# Patient Record
Sex: Male | Born: 1950
Health system: Southern US, Community
[De-identification: ages and names within clinical notes are randomized; demographics above are authoritative.]

## PROBLEM LIST (undated history)

## (undated) DIAGNOSIS — R531 Weakness: Secondary | ICD-10-CM

## (undated) DIAGNOSIS — E785 Hyperlipidemia, unspecified: Secondary | ICD-10-CM

## (undated) DIAGNOSIS — D571 Sickle-cell disease without crisis: Secondary | ICD-10-CM

## (undated) DIAGNOSIS — T300 Burn of unspecified body region, unspecified degree: Secondary | ICD-10-CM

## (undated) DIAGNOSIS — R200 Anesthesia of skin: Secondary | ICD-10-CM

## (undated) DIAGNOSIS — M25512 Pain in left shoulder: Secondary | ICD-10-CM

## (undated) DIAGNOSIS — I10 Essential (primary) hypertension: Secondary | ICD-10-CM

## (undated) DIAGNOSIS — M1990 Unspecified osteoarthritis, unspecified site: Secondary | ICD-10-CM

## (undated) HISTORY — PX: COLONOSCOPY: SHX174

## (undated) HISTORY — DX: Morbid (severe) obesity due to excess calories: E66.01

## (undated) HISTORY — PX: OTHER SURGICAL HISTORY: SHX169

## (undated) HISTORY — PX: FRACTURE SURGERY: SHX138

## (undated) HISTORY — DX: Hyperlipidemia, unspecified: E78.5

## (undated) HISTORY — DX: Weakness: R53.1

## (undated) HISTORY — PX: CHOLECYSTECTOMY: SHX55

## (undated) HISTORY — PX: GALLBLADDER SURGERY: SHX652

## (undated) HISTORY — DX: Burn of unspecified body region, unspecified degree: T30.0

## (undated) HISTORY — DX: Pain in left shoulder: M25.512

## (undated) HISTORY — DX: Essential (primary) hypertension: I10

## (undated) HISTORY — DX: Anesthesia of skin: R20.0

## (undated) HISTORY — DX: Unspecified osteoarthritis, unspecified site: M19.90

## (undated) HISTORY — DX: Sickle-cell disease without crisis: D57.1

---

## 2000-03-20 ENCOUNTER — Inpatient Hospital Stay (HOSPITAL_COMMUNITY): Admission: EM | Admit: 2000-03-20 | Discharge: 2000-03-26 | Payer: Self-pay | Admitting: Emergency Medicine

## 2000-03-20 ENCOUNTER — Encounter: Payer: Self-pay | Admitting: Emergency Medicine

## 2000-03-20 ENCOUNTER — Encounter: Payer: Self-pay | Admitting: Cardiology

## 2000-03-21 ENCOUNTER — Encounter: Payer: Self-pay | Admitting: Cardiology

## 2002-12-13 DIAGNOSIS — I2699 Other pulmonary embolism without acute cor pulmonale: Secondary | ICD-10-CM

## 2002-12-13 HISTORY — DX: Other pulmonary embolism without acute cor pulmonale: I26.99

## 2003-07-18 ENCOUNTER — Encounter: Payer: Self-pay | Admitting: General Surgery

## 2003-07-18 ENCOUNTER — Emergency Department (HOSPITAL_COMMUNITY): Admission: AC | Admit: 2003-07-18 | Discharge: 2003-07-18 | Payer: Self-pay

## 2004-03-25 ENCOUNTER — Inpatient Hospital Stay (HOSPITAL_COMMUNITY): Admission: EM | Admit: 2004-03-25 | Discharge: 2004-03-29 | Payer: Self-pay | Admitting: Emergency Medicine

## 2009-02-26 ENCOUNTER — Encounter: Admission: RE | Admit: 2009-02-26 | Discharge: 2009-02-26 | Payer: Self-pay | Admitting: Chiropractic Medicine

## 2009-04-18 ENCOUNTER — Encounter: Admission: RE | Admit: 2009-04-18 | Discharge: 2009-04-18 | Payer: Self-pay | Admitting: Family Medicine

## 2011-04-30 NOTE — Discharge Summary (Signed)
[place]  Patient:    Jerome Lee, Jerome Lee                      MRN: [id number] Adm. Date:  [id number] Disc. Date: [id number] Attending:  Pola Corn Dictator:   Lyla Son. Achilles Dunk.                           Discharge Summary  DISCHARGE DIAGNOSES: 1. Pulmonary embolism. 2. Pneumonia. 3. Pulmonary collapse. 4. Hyperlipidemia.  HISTORY OF PRESENT ILLNESS:  This is a 60 year old patient who was seen initially at the emergency department at [facility] with complaint of shortness of breath and right side pain.  The patient was noted to have pulse oximetries in the emergency department of 90 to 94% and he complained of severe right pain in the chest associated with shortness of breath.  The patient had a CT scan of the chest and the lower extremity and it was found to be positive for pulmonary embolus.  Patient was subsequently admitted for the same.  HOSPITAL COURSE:  Patient admitted to the medical service.  He was placed on heparin therapy by pharmacy protocol.  A pulmonary consultation was obtained due to persistent hypoxemia.  The white blood cell count on this patient is 13,000, hemoglobin 15.4.  The pulmonary consultant noted that there was bilateral pulmonary embolus with the right being greater than the left.  He also agreed with the opinion of right lower lobe pneumonia.  It was suspected that this was superimposed on a left lower lobe community-acquired pneumonia with hypoxemia.  Zithromax and Rocephin were used.  On April 10, the patient became less short of breath.  On March 24, 2000, it was the opinion that patient had shown significant improvement and was ready for discharge.  Pulmonary consultant agreed that the patient could be discharged with very close follow-up. DD:  04/27/00 TD:  04/28/00 Job: [id number]
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
## 2011-04-30 NOTE — Op Note (Signed)
NAME:  Jerome Lee, Jerome Lee                         ACCOUNT NO.:  [id number]   MEDICAL RECORD NO.:  [id number]                   PATIENT TYPE:  INP   LOCATION:  5004                                 FACILITY:  [facility]   PHYSICIAN:  Elana Alm. Thurston Hole, M.D.              DATE OF BIRTH:  [birth year]   DATE OF PROCEDURE:  03/26/2004  DATE OF DISCHARGE:                                 OPERATIVE REPORT   PREOPERATIVE DIAGNOSES:  1. Right tibial plateau fracture.  2. Right lateral meniscus tear.   POSTOPERATIVE DIAGNOSES:  1. Right tibial plateau fracture.  2. Right lateral meniscus tear.  3. Right lower leg pre-compartment syndrome.   PROCEDURES:  1. Open reduction and internal fixation of right tibial plateau fracture.  2. Right knee lateral meniscus repair.  3. Right leg anterior and lateral compartment fasciotomies.   SURGEON:  Elana Alm. Thurston Hole, M.D.   ASSISTANT:  Julien Girt, P.A.   ANESTHESIA:  General.   OPERATIVE TIME:  One hour.   COMPLICATIONS:  None.   DESCRIPTION OF PROCEDURE:  Mr Juhasz was brought to the operating room on  March 27, 2003, placed on the operating table in the supine position.  After  an adequate level of general anesthesia was obtained, his right leg was  prepped using sterile Duraprep and draped using sterile technique.  He  received Ancef 1 g IV preoperatively for prophylaxis.  The leg was  exsanguinated and a thigh tourniquet elevated at 375 mm.  Initially through  a 15 cm longitudinal incision based over the proximal tibia, initial  exposure was made.  The underlying subcutaneous tissues were incised along  with the skin incision.  Subperiosteally the anterior and lateral tibial  metaphysis was exposed.  An arthrotomy was performed as well in the lateral  aspect of the joint.  The fracture was well-identified in the anterior and  lateral tibial plateau.  The lateral meniscus was found to be torn as well  anteriorly, and this was repaired  primarily with 0 Vicryl sutures.  The  tibial plateau fracture was manually reduced and confirmed under  fluoroscopy.  After this was done, then a Synthes locking L plate was placed  on the anterior lateral aspect of the tibial plateau and under fluoroscopic  control the three proximal screws were placed after pre-drilling with  fluoroscopy guidance and then placing the appropriate length 5.5 mm screws.  The distal screw holes in the plate were then sequentially pre-drilled and  locking screws of appropriate length, 4.5 mm screws, were placed as well.  After this was done AP and lateral fluoroscopic x-rays confirmed anatomic  reduction of the fracture and satisfactory position of the hardware.  At  this point then anterior and lateral fasciotomies were performed.  There was  found to be moderate tightness to this compartment, but this was easily  decompressed.  The posterior compartment was not excessively tight.  The  tourniquet was released.  Copious irrigation was performed.  Only small  venous bleeders were found, and these were cauterized.  Then the arthrotomy  was closed, the fascia closed loosely over the plate with 0 Vicryl,  subcutaneous tissues closed with 0 Vicryl, skin closed with skin staples.  Sterile dressings were applied.  Distal neurologic function was found to be  intact, pulses 1+ and symmetric postoperatively.  The patient then awakened,  extubated, and taken to the recovery room in stable condition.  Needle and  sponge counts correct x2 at the end of the case.                                               Robert A. Thurston Hole, M.D.    RAW/MEDQ  D:  03/27/2004  T:  03/27/2004  Job:  [id number]   cc:   Workers Comp carrier
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
## 2013-01-18 ENCOUNTER — Telehealth: Payer: Self-pay | Admitting: Family Medicine

## 2013-01-18 ENCOUNTER — Ambulatory Visit
Admission: RE | Admit: 2013-01-18 | Discharge: 2013-01-18 | Disposition: A | Discharge status: Home / Self Care | Payer: BC Managed Care – PPO | Source: Ambulatory Visit | Attending: Emergency Medicine | Admitting: Emergency Medicine

## 2013-01-18 ENCOUNTER — Ambulatory Visit (INDEPENDENT_AMBULATORY_CARE_PROVIDER_SITE_OTHER): Payer: BC Managed Care – PPO | Admitting: Emergency Medicine

## 2013-01-18 VITALS — BP 147/78 | HR 62 | Temp 98.3°F | Resp 16 | Ht 65.0 in | Wt 244.0 lb

## 2013-01-18 DIAGNOSIS — R200 Anesthesia of skin: Secondary | ICD-10-CM

## 2013-01-18 DIAGNOSIS — R202 Paresthesia of skin: Secondary | ICD-10-CM

## 2013-01-18 DIAGNOSIS — R209 Unspecified disturbances of skin sensation: Secondary | ICD-10-CM

## 2013-01-18 NOTE — Progress Notes (Signed)
  Subjective:    Patient ID: Jerome Lee, male    DOB: 26-May-1951, 62 y.o.   MRN: [id number]  HPI yesterday around 3 PM patient had episode where he had numbness on the right side of his face. At that time he also felt a numb tingling sensation in the fourth and fifth fingers of his right hand. The numbness in the right side of the face resolved however he continues to feel numbness in his fourth and fifth fingers. He denies headache he denies any history of medical problems to include diabetes high blood heart disease or any other medical problems is not a heavy drinker does not smoke is on no medications.    Review of Systems     Objective:   Physical Exam physical exam reveals an alert cooperative gentleman who is in no distress his HEENT exam is unremarkable his neck is supple without carotid bruits his chest is clear to auscultation and percussion his heart is regular rate without murmurs rubs or gallops the abdomen is soft liver and spleen not large. Neurologically cranial nerves II through XII are intact. Motor strength is 5 out of 5 all muscle groups he has decreased sensation in the fourth and fifth fingers of his right hand        Assessment & Plan:  The numbness in his right hand seemed to be more peripheral than central in origin. We'll do a CT of the head to be sure this is okay. Referral being made to neurology to see if we can be out what the source of the numbness is. CT of the head report shows chronic microvascular changes. We'll proceed with carotid Doppler studies as well as neurology consult.

## 2013-01-18 NOTE — Patient Instructions (Addendum)
You can go to [facility] today for your CT scan. I have called [employer] and your scan has been authorized for today,the auth. number is  [id number].   Driving directions to [address], [place], [place] [address] [id number]  - more info    [address]  [place], [place] [address]     1. Head south on [address] toward [address]      0.5 mi    2. Sharp left onto [address]      0.6 mi    3. Turn left onto the [address] ramp      0.2 mi    4. Merge onto [address]      3.0 mi    5. Continue straight to stay on [address]      0.4 mi    6. Slight left to stay on [facility]  Destination will be on the right     1.0 mi     [address] [place], [place] [address]

## 2013-01-18 NOTE — Telephone Encounter (Signed)
[facility] called with call report for CT. See report. Verbally told Dr. Cleta Alberts. Per Dr. Cleta Alberts called Jerome Lee and told him we are making Neuro appt and Carotid studies are being set up and we will call him when we have that done. He voiced understanding.

## 2013-01-19 ENCOUNTER — Ambulatory Visit
Admission: RE | Admit: 2013-01-19 | Discharge: 2013-01-19 | Disposition: A | Discharge status: Home / Self Care | Payer: BC Managed Care – PPO | Source: Ambulatory Visit | Attending: Emergency Medicine | Admitting: Emergency Medicine

## 2013-01-19 DIAGNOSIS — R200 Anesthesia of skin: Secondary | ICD-10-CM

## 2013-01-22 ENCOUNTER — Other Ambulatory Visit: Payer: Self-pay | Admitting: Emergency Medicine

## 2013-01-22 DIAGNOSIS — R200 Anesthesia of skin: Secondary | ICD-10-CM

## 2013-02-26 ENCOUNTER — Other Ambulatory Visit: Payer: Self-pay | Admitting: Neurology

## 2013-02-26 DIAGNOSIS — R209 Unspecified disturbances of skin sensation: Secondary | ICD-10-CM

## 2013-03-15 ENCOUNTER — Other Ambulatory Visit: Payer: BC Managed Care – PPO

## 2013-03-29 ENCOUNTER — Ambulatory Visit (INDEPENDENT_AMBULATORY_CARE_PROVIDER_SITE_OTHER): Payer: BC Managed Care – PPO | Admitting: Neurology

## 2013-03-29 ENCOUNTER — Encounter: Payer: Self-pay | Admitting: Neurology

## 2013-03-29 VITALS — BP 129/75 | Ht 65.0 in | Wt 236.0 lb

## 2013-03-29 DIAGNOSIS — R5383 Other fatigue: Secondary | ICD-10-CM

## 2013-03-29 DIAGNOSIS — R531 Weakness: Secondary | ICD-10-CM

## 2013-03-29 DIAGNOSIS — T300 Burn of unspecified body region, unspecified degree: Secondary | ICD-10-CM | POA: Insufficient documentation

## 2013-03-29 DIAGNOSIS — R200 Anesthesia of skin: Secondary | ICD-10-CM | POA: Insufficient documentation

## 2013-03-29 DIAGNOSIS — R5381 Other malaise: Secondary | ICD-10-CM

## 2013-03-29 DIAGNOSIS — R209 Unspecified disturbances of skin sensation: Secondary | ICD-10-CM
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
## 2014-06-17 ENCOUNTER — Encounter: Payer: Self-pay | Admitting: Family Medicine

## 2014-06-17 ENCOUNTER — Ambulatory Visit (INDEPENDENT_AMBULATORY_CARE_PROVIDER_SITE_OTHER): Payer: BC Managed Care – PPO | Admitting: Family Medicine

## 2014-06-17 ENCOUNTER — Other Ambulatory Visit: Payer: Self-pay | Admitting: Family Medicine

## 2014-06-17 VITALS — BP 138/70 | HR 88 | Resp 16 | Ht 64.5 in | Wt 237.0 lb

## 2014-06-17 DIAGNOSIS — Z0000 Encounter for general adult medical examination without abnormal findings: Secondary | ICD-10-CM

## 2014-06-17 DIAGNOSIS — Z13220 Encounter for screening for lipoid disorders: Secondary | ICD-10-CM

## 2014-06-17 DIAGNOSIS — E669 Obesity, unspecified: Secondary | ICD-10-CM

## 2014-06-17 DIAGNOSIS — R1906 Epigastric swelling, mass or lump: Secondary | ICD-10-CM

## 2014-06-17 DIAGNOSIS — Z125 Encounter for screening for malignant neoplasm of prostate: Secondary | ICD-10-CM

## 2014-06-17 LAB — COMPREHENSIVE METABOLIC PANEL
ALT: 26 U/L (ref 0–53)
AST: 24 U/L (ref 0–37)
Albumin: 4.5 g/dL (ref 3.5–5.2)
Alkaline Phosphatase: 56 U/L (ref 39–117)
BUN: 10 mg/dL (ref 6–23)
CO2: 22 mEq/L (ref 19–32)
Calcium: 9.5 mg/dL (ref 8.4–10.5)
Chloride: 104 mEq/L (ref 96–112)
Creat: 1.19 mg/dL (ref 0.50–1.35)
Glucose, Bld: 91 mg/dL (ref 70–99)
Potassium: 4.7 mEq/L (ref 3.5–5.3)
Sodium: 139 mEq/L (ref 135–145)
Total Bilirubin: 1.6 mg/dL — ABNORMAL HIGH (ref 0.2–1.2)
Total Protein: 7.6 g/dL (ref 6.0–8.3)

## 2014-06-17 LAB — LIPID PANEL
Cholesterol: 233 mg/dL — ABNORMAL HIGH (ref 0–200)
HDL: 44 mg/dL (ref >39)
LDL Cholesterol: 177 mg/dL — ABNORMAL HIGH (ref 0–99)
Total CHOL/HDL Ratio: 5.3 Ratio
Triglycerides: 60 mg/dL (ref <150)
VLDL: 12 mg/dL (ref 0–40)

## 2014-06-17 LAB — CBC
HCT: 48.8 % (ref 39.0–52.0)
Hemoglobin: 16.5 g/dL (ref 13.0–17.0)
MCH: 28.4 pg (ref 26.0–34.0)
MCHC: 33.8 g/dL (ref 30.0–36.0)
MCV: 83.8 fL (ref 78.0–100.0)
Platelets: 266 10*3/uL (ref 150–400)
RBC: 5.82 MIL/uL — ABNORMAL HIGH (ref 4.22–5.81)
RDW: 13.7 % (ref 11.5–15.5)
WBC: 7.3 10*3/uL (ref 4.0–10.5)

## 2014-06-17 NOTE — Patient Instructions (Addendum)
Call [facility] and ask about your colonoscopy- it may be coming due [place], [place] [address]   Ph: [phone]   Get the shingles vaccine (zostavax) at a drug store at your convenience I will be in touch with your labs Your blood pressure looks ok.  You have lost a few lbs- good job!  Keep it up  Wt Readings from Last 3 Encounters:  06/17/14 237 lb (107.502 kg)  03/29/13 236 lb (107.049 kg)  01/18/13 244 lb (110.678 kg)
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
## 2014-06-18 LAB — PSA: PSA: 1.07 ng/mL (ref <=4.00)

## 2014-06-21 LAB — BILIRUBIN, FRACTIONATED(TOT/DIR/INDIR)
Bilirubin, Direct: 0.2 mg/dL (ref 0.0–0.3)
Indirect Bilirubin: 1.4 mg/dL — ABNORMAL HIGH (ref 0.2–1.2)
Total Bilirubin: 1.6 mg/dL — ABNORMAL HIGH (ref 0.2–1.2)

## 2014-06-22 ENCOUNTER — Encounter: Payer: Self-pay | Admitting: Family Medicine

## 2014-10-21 ENCOUNTER — Ambulatory Visit: Payer: BC Managed Care – PPO | Admitting: Family Medicine

## 2014-11-18 ENCOUNTER — Encounter: Payer: Self-pay | Admitting: Family Medicine

## 2014-11-18 ENCOUNTER — Ambulatory Visit (INDEPENDENT_AMBULATORY_CARE_PROVIDER_SITE_OTHER): Payer: BC Managed Care – PPO | Admitting: Family Medicine

## 2014-11-18 VITALS — BP 148/77 | HR 73 | Temp 98.2°F | Resp 16 | Ht 64.5 in | Wt 235.0 lb

## 2014-11-18 DIAGNOSIS — E669 Obesity, unspecified: Secondary | ICD-10-CM

## 2014-11-18 DIAGNOSIS — R1906 Epigastric swelling, mass or lump: Secondary | ICD-10-CM

## 2014-11-18 DIAGNOSIS — Z23 Encounter for immunization: Secondary | ICD-10-CM

## 2014-11-18 LAB — COMPREHENSIVE METABOLIC PANEL
ALT: 23 U/L (ref 0–53)
AST: 19 U/L (ref 0–37)
Albumin: 4.3 g/dL (ref 3.5–5.2)
Alkaline Phosphatase: 59 U/L (ref 39–117)
BUN: 9 mg/dL (ref 6–23)
CO2: 28 mEq/L (ref 19–32)
Calcium: 10 mg/dL (ref 8.4–10.5)
Chloride: 103 mEq/L (ref 96–112)
Creat: 1.29 mg/dL (ref 0.50–1.35)
Glucose, Bld: 99 mg/dL (ref 70–99)
Potassium: 4.7 mEq/L (ref 3.5–5.3)
Sodium: 139 mEq/L (ref 135–145)
Total Bilirubin: 1.4 mg/dL — ABNORMAL HIGH (ref 0.2–1.2)
Total Protein: 7.7 g/dL (ref 6.0–8.3)

## 2014-11-18 LAB — HEMOGLOBIN A1C
Hgb A1c MFr Bld: 4.8 % (ref <5.7)
Mean Plasma Glucose: 91 mg/dL (ref <117)

## 2014-11-18 LAB — LIPID PANEL
Cholesterol: 195 mg/dL (ref 0–200)
HDL: 44 mg/dL (ref >39)
LDL Cholesterol: 135 mg/dL — ABNORMAL HIGH (ref 0–99)
Total CHOL/HDL Ratio: 4.4 Ratio
Triglycerides: 80 mg/dL (ref <150)
VLDL: 16 mg/dL (ref 0–40)
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
## 2014-11-18 NOTE — Patient Instructions (Addendum)
Please do work on getting about 30 minutes of brisk walking most days of the week!  This will really improve your health and help with weight loss.  Also consider joining weight watchers for group support and help with weight loss.  You are in danger of developing high blood pressure and diabetes if you do not lose weight I will be in touch with your labs asap.   We will set up your visit with [facility] to look at your abdominal mass and also have your colonoscopy. Please do be sure to get this done soon because I am concerned about this mass,  If you do not hear about this appt please call me  Thanks for getting your flu shot today!!

## 2016-01-02 ENCOUNTER — Encounter: Payer: Self-pay | Admitting: Family Medicine

## 2016-01-07 ENCOUNTER — Encounter: Payer: Self-pay | Admitting: Family Medicine

## 2016-11-27 ENCOUNTER — Emergency Department (HOSPITAL_COMMUNITY): Payer: BC Managed Care – PPO

## 2016-11-27 ENCOUNTER — Encounter (HOSPITAL_COMMUNITY): Payer: Self-pay | Admitting: *Deleted

## 2016-11-27 ENCOUNTER — Emergency Department (HOSPITAL_COMMUNITY)
Admission: EM | Admit: 2016-11-27 | Discharge: 2016-11-28 | Disposition: A | Discharge status: Home / Self Care | Payer: BC Managed Care – PPO | Attending: Emergency Medicine | Admitting: Emergency Medicine

## 2016-11-27 DIAGNOSIS — R0789 Other chest pain: Secondary | ICD-10-CM

## 2016-11-27 DIAGNOSIS — I2699 Other pulmonary embolism without acute cor pulmonale: Secondary | ICD-10-CM | POA: Insufficient documentation

## 2016-11-27 DIAGNOSIS — R071 Chest pain on breathing: Secondary | ICD-10-CM | POA: Diagnosis present

## 2016-11-27 LAB — BASIC METABOLIC PANEL
Anion gap: 11 (ref 5–15)
BUN: 12 mg/dL (ref 6–20)
CO2: 23 mmol/L (ref 22–32)
Calcium: 9.7 mg/dL (ref 8.9–10.3)
Chloride: 105 mmol/L (ref 101–111)
Creatinine, Ser: 1.2 mg/dL (ref 0.61–1.24)
GFR calc Af Amer: >60 mL/min (ref >60)
GFR calc non Af Amer: >60 mL/min (ref >60)
Glucose, Bld: 102 mg/dL — ABNORMAL HIGH (ref 65–99)
Potassium: 3.8 mmol/L (ref 3.5–5.1)
Sodium: 139 mmol/L (ref 135–145)

## 2016-11-27 LAB — CBC
HCT: 46.9 % (ref 39.0–52.0)
Hemoglobin: 16 g/dL (ref 13.0–17.0)
MCH: 28.3 pg (ref 26.0–34.0)
MCHC: 34.1 g/dL (ref 30.0–36.0)
MCV: 82.9 fL (ref 78.0–100.0)
Platelets: 215 10*3/uL (ref 150–400)
RBC: 5.66 MIL/uL (ref 4.22–5.81)
RDW: 13.2 % (ref 11.5–15.5)
WBC: 11.3 10*3/uL — ABNORMAL HIGH (ref 4.0–10.5)

## 2016-11-27 LAB — I-STAT TROPONIN, ED: Troponin i, poc: 0 ng/mL (ref 0.00–0.08)

## 2016-11-27 MED ORDER — GI COCKTAIL ~~LOC~~
30.0000 mL | Freq: Once | ORAL | Status: AC
  Administered 2016-11-27: 30 mL via ORAL
  Filled 2016-11-27: qty 30
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
## 2016-11-27 NOTE — ED Triage Notes (Signed)
Pt is here for central chest pain that began today.  Pt initially thought it was gas. Pain increases when taking a deep breath.  No cardiac history.  Pt reports hx of blood clot in his lung and states that the pain feels similar.  Pt denies any SOB.

## 2016-11-28 ENCOUNTER — Emergency Department (HOSPITAL_COMMUNITY): Payer: BC Managed Care – PPO

## 2016-11-28 LAB — I-STAT TROPONIN, ED: Troponin i, poc: 0 ng/mL (ref 0.00–0.08)

## 2016-11-28 LAB — D-DIMER, QUANTITATIVE: D-Dimer, Quant: 1.12 ug/mL-FEU — ABNORMAL HIGH (ref 0.00–0.50)

## 2016-11-28 MED ORDER — FUROSEMIDE 10 MG/ML IJ SOLN
20.0000 mg | Freq: Once | INTRAMUSCULAR | Status: AC
  Administered 2016-11-28: 20 mg via INTRAVENOUS
  Filled 2016-11-28: qty 2

## 2016-11-28 MED ORDER — RIVAROXABAN (XARELTO) VTE STARTER PACK (15 & 20 MG): ORAL_TABLET | ORAL | 0 refills | Status: DC

## 2016-11-28 MED ORDER — ONDANSETRON HCL 4 MG/2ML IJ SOLN
4.0000 mg | Freq: Once | INTRAMUSCULAR | Status: AC
  Administered 2016-11-28: 4 mg via INTRAVENOUS
  Filled 2016-11-28: qty 2

## 2016-11-28 MED ORDER — MORPHINE SULFATE (PF) 4 MG/ML IV SOLN
4.0000 mg | Freq: Once | INTRAVENOUS | Status: AC
  Administered 2016-11-28: 4 mg via INTRAVENOUS
  Filled 2016-11-28: qty 1

## 2016-11-28 MED ORDER — IOPAMIDOL (ISOVUE-370) INJECTION 76%
INTRAVENOUS | Status: AC
  Administered 2016-11-28: 100 mL
  Filled 2016-11-28: qty 100

## 2016-11-28 MED ORDER — RIVAROXABAN 15 MG PO TABS
15.0000 mg | ORAL_TABLET | Freq: Once | ORAL | Status: AC
  Administered 2016-11-28: 15 mg via ORAL
  Filled 2016-11-28: qty 1

## 2016-11-28 NOTE — ED Notes (Signed)
Pt back from CT

## 2016-11-28 NOTE — ED Notes (Signed)
Pt education on xarelto done by pharmacy.

## 2016-11-28 NOTE — ED Provider Notes (Signed)
Care assumed from Dr. Maryan Rued. CTPE pending for atypical chest pain with remote history of PE.  Troponin negative after >12 hours of pain.  CT shows very small nonocclusive pulmonary embolism. Patient is not hypoxic and not tachycardic. Denies any shortness of breath. His chest pain is unchanged. We'll treat with xarelto. Risks and benefits discussed with patient.  He is ambulatory without desaturation and comfortable with plan for outpatient treatment. Return precautions and PCP followup discussed.  Blood pressure 136/82, pulse 76, temperature 98 F (36.7 C), temperature source Oral, resp. rate 18, weight 230 lb (104.3 kg), SpO2 99 %.      Ezequiel Essex, MD 11/28/16 1002

## 2016-11-28 NOTE — ED Notes (Signed)
Patient transported to CT 

## 2016-11-28 NOTE — Discharge Instructions (Signed)
Take the blood thinner as prescribed. [place] with your doctor. Return to the ED if you develop new or worsening symptoms.  Information on my medicine - XARELTO (rivaroxaban)  This medication education was reviewed with me or my healthcare representative as part of my discharge preparation.  The pharmacist that spoke with me during my hospital stay was:  Sindy Guadeloupe, [place]? Xarelto was prescribed to treat blood clots that may have been found in the veins of your legs (deep vein thrombosis) or in your lungs (pulmonary embolism) and to reduce the risk of them occurring again.  What do you need to know about Xarelto? The starting dose is one 15 mg tablet taken TWICE daily with food for the FIRST 21 DAYS then on 12/17/16  the dose is changed to one 20 mg tablet taken ONCE A DAY with your evening meal.  DO NOT stop taking Xarelto without talking to the health care provider who prescribed the medication.  Refill your prescription for 20 mg tablets before you run out.  After discharge, you should have regular check-up appointments with your healthcare provider that is prescribing your Xarelto.  In the future your dose may need to be changed if your kidney function changes by a significant amount.  What do you do if you miss a dose? If you are taking Xarelto TWICE DAILY and you miss a dose, take it as soon as you remember. You may take two 15 mg tablets (total 30 mg) at the same time then resume your regularly scheduled 15 mg twice daily the next day.  If you are taking Xarelto ONCE DAILY and you miss a dose, take it as soon as you remember on the same day then continue your regularly scheduled once daily regimen the next day. Do not take two doses of Xarelto at the same time.   Important Safety Information Xarelto is a blood thinner medicine that can cause bleeding. You should call your healthcare provider right away if you experience any of the  following: ? Bleeding from an injury or your nose that does not stop. ? Unusual colored urine (red or dark brown) or unusual colored stools (red or black). ? Unusual bruising for unknown reasons. ? A serious fall or if you hit your head (even if there is no bleeding).  Some medicines may interact with Xarelto and might increase your risk of bleeding while on Xarelto. To help avoid this, consult your healthcare provider or pharmacist prior to using any new prescription or non-prescription medications, including herbals, vitamins, non-steroidal anti-inflammatory drugs (NSAIDs) and supplements.  This website has more information on Xarelto: [email or web address]-benson.[email or web address]

## 2016-11-28 NOTE — ED Notes (Signed)
Pt. Ambulated 151ft. SpO2 did not drop below 92.

## 2016-12-03 ENCOUNTER — Ambulatory Visit (INDEPENDENT_AMBULATORY_CARE_PROVIDER_SITE_OTHER): Payer: BC Managed Care – PPO | Admitting: Emergency Medicine

## 2016-12-03 VITALS — BP 126/80 | HR 66 | Temp 98.8°F | Resp 17 | Ht 65.5 in | Wt 225.0 lb

## 2016-12-03 DIAGNOSIS — I2699 Other pulmonary embolism without acute cor pulmonale: Secondary | ICD-10-CM

## 2016-12-03 NOTE — Patient Instructions (Addendum)
IF you received an x-ray today, you will receive an invoice from [facility]. Please contact [facility] at [phone] with questions or concerns regarding your invoice.   IF you received labwork today, you will receive an invoice from [facility]. Please contact [facility] at [phone] with questions or concerns regarding your invoice.   Our billing staff will not be able to assist you with questions regarding bills from these companies.  You will be contacted with the lab results as soon as they are available. The fastest way to get your results is to activate your My Chart account. Instructions are located on the last page of this paperwork. If you have not heard from [place] regarding the results in 2 weeks, please contact this office.      Pulmonary Embolism A pulmonary embolism (PE) is a sudden blockage or decrease of blood flow in one lung or both lungs. Most blockages come from a blood clot that travels from the legs or the pelvis to the lungs. PE is a dangerous and potentially life-threatening condition if it is not treated right away. What are the causes? A pulmonary embolism occurs most commonly when a blood clot travels from one of your veins to your lungs. Rarely, PE is caused by air, fat, amniotic fluid, or part of a tumor traveling through your veins to your lungs. What increases the risk? A PE is more likely to develop in:  People who smoke.  People who areolder, especially over 57 years of age.  People who are overweight (obese).  People who sit or lie still for a long time, such as during long-distance travel (over 4 hours), bed rest, hospitalization, or during recovery from certain medical conditions like a stroke.  People who do not engage in much physical activity (sedentary lifestyle).  People who have chronic breathing disorders.  People whohave a personal or family history of blood clots or blood clotting disease.  People whohave  peripheral vascular disease (PVD), diabetes, or some types of cancer.  People who haveheart disease, especially if the person had a recent heart attack or has congestive heart failure.  People who have neurological diseases that affect the legs (leg paresis).  People who have had a traumatic injury, such as breaking a hip or leg.  People whohave recently had major or lengthy surgery, especially on the hip, knee, or abdomen.  People who have hada central line placed inside a large vein.  People who takemedicines that contain the hormone estrogen. These include birth control pills and hormone replacement therapy.  Pregnancy or during childbirth or the postpartum period. What are the signs or symptoms? The symptoms of a PE usually start suddenly and include:  Shortness of breath while active or at rest.  Coughing or coughing up blood or blood-tinged mucus.  Chest pain that is often worse with deep breaths.  Rapid or irregular heartbeat.  Feeling light-headed or dizzy.  Fainting.  Feelinganxious.  Sweating. There may also be pain and swelling in a leg if that is where the blood clot started. These symptoms may represent a serious problem that is an emergency. Do not wait to see if the symptoms will go away. Get medical help right away. Call your local emergency services (911 in the [place] Do not drive yourself to the hospital.  How is this diagnosed? Your health care provider will take a medical history and perform a physical exam. You may also have other tests, including:  Blood tests to assess the  clotting properties of your blood, assess oxygen levels in your blood, and find blood clots.  Imaging tests, such as CT, ultrasound, MRI, X-ray, and other tests to see if you have clots anywhere in your body.  An electrocardiogram (ECG) to look for heart strain from blood clots in the lungs. How is this treated? The main goals of PE treatment are:  To stop a blood clot from  growing larger.  To stop new blood clots from forming. The type of treatment that you receive depends on many factors, such as the cause of your PE, your risk for bleeding or developing more clots, and other medical conditions that you have. Sometimes, a combination of treatments is necessary. This condition may be treated with:  Medicines, including newer oral blood thinners (anticoagulants), warfarin, low molecular weight heparins, thrombolytics, or heparins.  Wearing compression stockings or using different types of devices.  Surgery (rare) to remove the blood clot or to place a filter in your abdomen to stop the blood clot from traveling to your lungs. Treatments for a PE are often divided into immediate treatment, long-term treatment (up to 3 months after PE), and extended treatment (more than 3 months after PE). Your treatment may continue for several months. This is called maintenance therapy, and it is used to prevent the forming of new blood clots. You can work with your health care provider to choose the treatment program that is best for you. What are anticoagulants?  Anticoagulants are medicines that treat PEs. They can stop current blood clots from growing and stop new clots from forming. They cannot dissolve existing clots. Your body dissolves clots by itself over time. Anticoagulants are given by mouth, by injection, or through an IV tube. What are thrombolytics?  Thrombolytics are clot-dissolving medicines that are used to dissolve a PE. They carry a high risk of bleeding, so they tend to be used only in severe cases or if you have very low blood pressure. Follow these instructions at home: If you are taking a newer oral anticoagulant:  Take the medicine every single day at the same time each day.  Understand what foods and drugs interact with this medicine.  Understand that there are no regular blood tests required when using this medicine.  Understandthe side effects of  this medicine, including excessive bruising or bleeding. Ask your health care provider or pharmacist about other possible side effects. If you are taking warfarin:  Understand how to take warfarin and know which foods can affect how warfarin works in Veterinary surgeon.  Understand that it is dangerous to taketoo much or too little warfarin. Too much warfarin increases the risk of bleeding. Too little warfarin continues to allow the risk for blood clots.  Follow your PT and INR blood testing schedule. The PT and INR results allow your health care provider to adjust your dose of warfarin. It is very important that you have your PT and INR tested as often as told by your health care provider.  Avoid major changes in your diet, or tell your health care provider before you change your diet. Arrange a visit with a registered dietitian to answer your questions. Many foods, especially foods that are high in vitamin K, can interfere with warfarin and affect the PT and INR results. Eat a consistent amount of foods that are high in vitamin K, such as:  Spinach, kale, broccoli, cabbage, collard greens, turnip greens, Brussels sprouts, peas, cauliflower, seaweed, and parsley.  Beef liver and pork liver.  Green tea.  Soybean oil.  Tell your health care provider about any and all medicines, vitamins, and supplements that you take, including aspirin and other over-the-counter anti-inflammatory medicines. Be especially cautious with aspirin and anti-inflammatory medicines. Do not take those before you ask your health care provider if it is safe to do so. This is important because many medicines can interfere with warfarin and affect the PT and INR results.  Do not start or stop taking any over-the-counter or prescription medicine unless your health care provider or pharmacist tells you to do so. If you take warfarin, you will also need to do these things:  Hold pressure over cuts for longer than usual.  Tell your  dentist and other health care providers that you are taking warfarin before you have any procedures in which bleeding may occur.  Avoid alcohol or drink very small amounts. Tell your health care provider if you change your alcohol intake.  Do not use tobacco products, including cigarettes, chewing tobacco, and e-cigarettes. If you need help quitting, ask your health care provider.  Avoid contact sports. General instructions  Take over-the-counter and prescription medicines only as told by your health care provider. Anticoagulant medicines can have side effects, including easy bruising and difficulty stopping bleeding. If you are prescribed an anticoagulant, you will also need to do these things:  Hold pressure over cuts for longer than usual.  Tell your dentist and other health care providers that you are taking anticoagulants before you have any procedures in which bleeding may occur.  Avoid contact sports.  Wear a medical alert bracelet or carry a medical alert card that says you have had a PE.  Ask your health care provider how soon you can go back to your normal activities. Stay active to prevent new blood clots from forming.  Make sure to exercise while traveling or when you have been sitting or standing for a long period of time. It is very important to exercise. Exercise your legs by walking or by tightening and relaxing your leg muscles often. Take frequent walks.  Wear compression stockings as told by your health care provider to help prevent more blood clots from forming.  Do not use tobacco products, including cigarettes, chewing tobacco, and e-cigarettes. If you need help quitting, ask your health care provider.  Keep all follow-up appointments with your health care provider. This is important. How is this prevented? Take these actions to decrease your risk of developing another PE:  Exercise regularly. For at least 30 minutes every day, engage in:  Activity that involves  moving your arms and legs.  Activity that encourages good blood flow through your body by increasing your heart rate.  Exercise your arms and legs every hour during long-distance travel (over 4 hours). Drink plenty of water and avoid drinking alcohol while traveling.  Avoid sitting or lying in bed for long periods of time without moving your legs.  Maintain a weight that is appropriate for your height. Ask your health care provider what weight is healthy for you.  If you are a woman who is over 62 years of age, avoid unnecessary use of medicines that contain estrogen. These include birth control pills.  Do not smoke, especially if you take estrogen medicines. If you need help quitting, ask your health care provider.  If you are at very high risk for PE, wear compression stockings.  If you recently had a PE, have regularly scheduled ultrasound testing on your legs to check for new  blood clots. If you are hospitalized, prevention measures may include:  Early walking after surgery, as soon as your health care provider says that it is safe.  Receiving anticoagulants to prevent blood clots. If you cannot take anticoagulants, other options may be available, such as wearing compression stockings or using different types of devices. Get help right away if:  You have new or increased pain, swelling, or redness in an arm or leg.  You have numbness or tingling in an arm or leg.  You have shortness of breath while active or at rest.  You have chest pain.  You have a rapid or irregular heartbeat.  You feel light-headed or dizzy.  You cough up blood.  You notice blood in your vomit, bowel movement, or urine.  You have a fever. These symptoms may represent a serious problem that is an emergency. Do not wait to see if the symptoms will go away. Get medical help right away. Call your local emergency services (911 in the [place] Do not drive yourself to the hospital.  This information is not  intended to replace advice given to you by your health care provider. Make sure you discuss any questions you have with your health care provider. Document Released: 11/26/2000 Document Revised: 05/06/2016 Document Reviewed: 03/26/2015 Elsevier Interactive Patient Education  2017 [employer].

## 2016-12-03 NOTE — Progress Notes (Signed)
Jerome Lee 65 y.o. S/p PE last Saturday; seen in ER and diagnosed with "small" Pe; not admitted, started on Rivaroxaban and sent home after 12 hours in the Ed. Advised to f/u with PCP. Ct report reviewed; shows tiny non-oclussive filling defect to RLL,     Chief Complaint  Patient presents with  . Follow-up    blood clot     HISTORY OF PRESENT ILLNESS: This is a 65 y.o. male complaining of f/u for blood clot in chest.  Prior to Admission medications   Medication Sig Start Date End Date Taking? Authorizing Provider  Rivaroxaban 15 & 20 MG TBPK Take as directed on package: Start with one 15mg  tablet by mouth twice a day with food. On Day 22, switch to one 20mg  tablet once a day with food. 11/28/16  Yes Ezequiel Essex, MD    No Known Allergies  Patient Active Problem List   Diagnosis Date Noted  . Full-thickness skin loss due to burn (third degree NOS), unspecified site   . Morbid obesity ([place])   . Weakness   . Numbness     Past Medical History:  Diagnosis Date  . Full-thickness skin loss due to burn (third degree NOS), unspecified site   . Morbid obesity ([place])   . Numbness   . Weakness     Past Surgical History:  Procedure Laterality Date  . CHOLECYSTECTOMY    . FRACTURE SURGERY    . GALLBLADDER SURGERY     2004    Social History   Social History  . Marital status: Married    Spouse name: N/A  . Number of children: N/A  . Years of education: N/A   Occupational History  . Not on file.   Social History Main Topics  . Smoking status: Never Smoker  . Smokeless tobacco: Never Used  . Alcohol use Yes  . Drug use: No  . Sexual activity: Not on file   Other Topics Concern  . Not on file   Social History Narrative  . No narrative on file    No family history on file.   Review of Systems  Constitutional: Negative for chills, diaphoresis, fever and weight loss.  [name]: Negative for congestion, ear pain, hearing loss, nosebleeds, sinus pain and sore  throat.   Eyes: Negative for blurred vision, double vision, pain and discharge.  Respiratory: Negative for cough, hemoptysis, sputum production, shortness of breath and wheezing.   Cardiovascular: Negative for chest pain, palpitations, orthopnea, claudication, leg swelling and PND.  Gastrointestinal: Negative for abdominal pain, blood in stool, constipation, diarrhea, melena, nausea and vomiting.  Genitourinary: Negative for dysuria, flank pain and hematuria.  Musculoskeletal: Negative for back pain, joint pain, myalgias and neck pain.  Skin: Negative for itching and rash.  Neurological: Negative for dizziness, sensory change, speech change, focal weakness, weakness and headaches.  Endo/Heme/Allergies: Does not bruise/bleed easily.  Psychiatric/Behavioral: Negative.   All other systems reviewed and are negative.    Physical Exam  Constitutional: He is oriented to person, place, and time. He appears well-developed and well-nourished.  [name]:  Head: Normocephalic and atraumatic.  Eyes: Conjunctivae and EOM are normal. Pupils are equal, round, and reactive to light.  Neck: Normal range of motion. Neck supple.  Cardiovascular: Normal rate, regular rhythm, normal heart sounds and intact distal pulses.   Pulmonary/Chest: Effort normal and breath sounds normal.  Abdominal: Soft. Bowel sounds are normal. He exhibits no distension. There is no tenderness.  Musculoskeletal: Normal range of motion. He  exhibits no edema or tenderness.  Neurological: He is alert and oriented to person, place, and time. He displays normal reflexes. No sensory deficit. He exhibits normal muscle tone. Coordination normal.  Skin: Skin is warm and dry.  Psychiatric: He has a normal mood and affect. His behavior is normal.  Vitals reviewed. Blood pressure 126/80, pulse 66, temperature 98.8 F (37.1 C), temperature source Oral, resp. rate 17, height 5' 5.5" (1.664 m), weight 225 lb (102.1 kg), SpO2 96 %.    ASSESSMENT &  PLAN:  1. Other acute pulmonary embolism without acute cor pulmonale (HCC) Stable. No red flags. Rivaroxaban precautions/side effects d/w patient. - Ambulatory referral to Pulmonology - Care order/instruction:     Agustina Caroli, MD [facility]

## 2016-12-20 ENCOUNTER — Ambulatory Visit (INDEPENDENT_AMBULATORY_CARE_PROVIDER_SITE_OTHER): Payer: BC Managed Care – PPO | Admitting: Pulmonary Disease

## 2016-12-20 ENCOUNTER — Encounter: Payer: Self-pay | Admitting: Pulmonary Disease

## 2016-12-20 VITALS — BP 128/78 | HR 71 | Ht 65.0 in | Wt 227.2 lb

## 2016-12-20 DIAGNOSIS — I2699 Other pulmonary embolism without acute cor pulmonale: Secondary | ICD-10-CM | POA: Diagnosis not present

## 2016-12-20 NOTE — Progress Notes (Signed)
GREGERY DEVINCENZI    OB:[id number]    21-Jan-1951  Primary Care Physician:COPLAND,JESSICA, MD  Referring Physician: Horald Pollen, MD [place], [place], [place] [address]  Chief complaint:  Consult for PE  HPI: Mr. Lunday is a 66 year old with past medical history of DVT, PE in 2003. She was evaluated in the ED in mid December for acute onset chest pain, dyspnea. He underwent a CTA which showed a very small nonocclusive filling defect in the right lower lobe pulmonary artery. Patient was stable with no evidence of right heart strain, troponins were negative. He was not hypoxic or tachycardic. He was started on Xarelto and discharged. His DVT PE in 2003 occurred in the setting of left ankle fracture. This time around he denies any immobilization, prolonged air travel. There is no family history of blood clots.  He feels well today in the office. He denies any chest pain, dyspnea, wheezing, cough, sputum production.  Outpatient Encounter Prescriptions as of 12/20/2016  Medication Sig  . Rivaroxaban 15 & 20 MG TBPK Take as directed on package: Start with one 15mg  tablet by mouth twice a day with food. On Day 22, switch to one 20mg  tablet once a day with food.   No facility-administered encounter medications on file as of 12/20/2016.     Allergies as of 12/20/2016  . (No Known Allergies)    Past Medical History:  Diagnosis Date  . Full-thickness skin loss due to burn (third degree NOS), unspecified site   . Morbid obesity ([place])   . Numbness   . Weakness     Past Surgical History:  Procedure Laterality Date  . CHOLECYSTECTOMY    . FRACTURE SURGERY    . GALLBLADDER SURGERY     2004    No family history on file.  Social History   Social History  . Marital status: Married    Spouse name: N/A  . Number of children: N/A  . Years of education: N/A   Occupational History  . Not on file.   Social History Main Topics  . Smoking status: Never Smoker  . Smokeless  tobacco: Never Used  . Alcohol use Yes  . Drug use: No  . Sexual activity: Not on file   Other Topics Concern  . Not on file   Social History Narrative  . No narrative on file   Review of systems: Review of Systems  Constitutional: Negative for fever and chills.  [name]: Negative.   Eyes: Negative for blurred vision.  Respiratory: as per HPI  Cardiovascular: Negative for chest pain and palpitations.  Gastrointestinal: Negative for vomiting, diarrhea, blood per rectum. Genitourinary: Negative for dysuria, urgency, frequency and hematuria.  Musculoskeletal: Negative for myalgias, back pain and joint pain.  Skin: Negative for itching and rash.  Neurological: Negative for dizziness, tremors, focal weakness, seizures and loss of consciousness.  Endo/Heme/Allergies: Negative for environmental allergies.  Psychiatric/Behavioral: Negative for depression, suicidal ideas and hallucinations.  All other systems reviewed and are negative.  Physical Exam: Blood pressure 128/78, pulse 71, height 5\' 5"  (1.651 m), weight 227 lb 3.2 oz (103.1 kg), SpO2 98 %. Gen:      No acute distress HEENT:  EOMI, sclera anicteric Neck:     No masses; no thyromegaly Lungs:    Clear to auscultation bilaterally; normal respiratory effort CV:         Regular rate and rhythm; no murmurs Abd:      + bowel sounds;  soft, non-tender; no palpable masses, no distension Ext:    No edema; adequate peripheral perfusion Skin:      Warm and dry; no rash Neuro: alert and oriented x 3 Psych: normal mood and affect  Data Reviewed: CTA 11/28/16-tiny nonocclusive filling defect in the right lower lobe segmental vessel. All images personally reviewed.  BMET    Component Value Date/Time   NA 139 11/27/2016 2024   K 3.8 11/27/2016 2024   CL 105 11/27/2016 2024   CO2 23 11/27/2016 2024   GLUCOSE 102 (H) 11/27/2016 2024   BUN 12 11/27/2016 2024   CREATININE 1.20 11/27/2016 2024   CREATININE 1.29 11/18/2014 0836   CALCIUM  9.7 11/27/2016 2024   GFRNONAA >60 11/27/2016 2024   GFRAA >60 11/27/2016 2024   CBC    Component Value Date/Time   WBC 11.3 (H) 11/27/2016 2024   RBC 5.66 11/27/2016 2024   HGB 16.0 11/27/2016 2024   HCT 46.9 11/27/2016 2024   PLT 215 11/27/2016 2024   MCV 82.9 11/27/2016 2024   MCH 28.3 11/27/2016 2024   MCHC 34.1 11/27/2016 2024   RDW 13.2 11/27/2016 2024    Assessment:  Mr. Manieri is here for evaluation of pulmonary embolus without right heart strain. I personally reviewed the images and the clot appears very small. This could very well represent a contrast artifact or a residual of clot from his prior pulmonary embolism. However as he had presented with typical symptoms of chest pain, dyspnea we will treat this as a real PE.  Given that this is his second episode of pulmonary embolus and this time appears to have been unprovoked. Our recommendation is to continue indefinite anticoagulation. He is concerned about increased risk of bleeding. We will reevaluate in 6 months time and if his concerns remain then we can consider getting a repeat CT scan and discuss stopping anticoagulation at that time.  Plan/Recommendations: - Continue xarelto  Follow up in 6 months.  Marshell Garfinkel MD [facility] Pager [phone] If no answer or after 3pm call: [phone] 12/20/2016, 4:08 PM   CC: Agustina Caroli [place], *

## 2016-12-20 NOTE — Patient Instructions (Signed)
His CT scan results were reviewed with you today. Continue using the xarelto as prescribed.  Return in 6 months.

## 2016-12-28 ENCOUNTER — Telehealth: Payer: Self-pay | Admitting: Pulmonary Disease

## 2016-12-28 MED ORDER — RIVAROXABAN 20 MG PO TABS: 20.0000 mg | ORAL_TABLET | Freq: Every day | ORAL | 5 refills | Status: DC

## 2016-12-28 NOTE — Telephone Encounter (Signed)
Spoke with pt. He needs a refill on Xarelto 20mg . Rx has been sent in. Nothing further was needed.

## 2017-06-09 ENCOUNTER — Other Ambulatory Visit: Payer: Self-pay | Admitting: Pulmonary Disease

## 2017-06-27 ENCOUNTER — Encounter: Payer: Self-pay | Admitting: Pulmonary Disease

## 2017-06-27 ENCOUNTER — Ambulatory Visit (INDEPENDENT_AMBULATORY_CARE_PROVIDER_SITE_OTHER): Payer: BC Managed Care – PPO | Admitting: Pulmonary Disease

## 2017-06-27 VITALS — BP 126/78 | HR 80 | Ht 65.0 in | Wt 232.6 lb

## 2017-06-27 DIAGNOSIS — I2699 Other pulmonary embolism without acute cor pulmonale: Secondary | ICD-10-CM

## 2017-06-27 NOTE — Patient Instructions (Signed)
Continue using the Xarelto for now I'll review CT scan with the radiologist and determine how much longer to continue on the anticoagulation Return to clinic in 6 months

## 2017-06-27 NOTE — Progress Notes (Addendum)
Jerome Lee    [id number]    06/29/1951  Primary Care Physician:Copland, Gay Filler, MD  Referring Physician: Darreld Mclean, [address] [place], [place] [address]  Chief complaint:  Follow up for PE  HPI: Jerome Lee is a 66 year old with past medical history of DVT, PE in 2001. He was evaluated in the ED in mid December 2017 for acute onset chest pain, dyspnea. He underwent a CTA which showed a very small nonocclusive filling defect in the right lower lobe pulmonary artery. Patient was stable with no evidence of right heart strain, troponins were negative. He was not hypoxic or tachycardic. He was started on Xarelto and discharged. His DVT PE in 2003 occurred in the setting of left ankle fracture. This time around he denies any immobilization, prolonged air travel. There is no family history of blood clots.  He feels well today in the office. He denies any chest pain, dyspnea, wheezing, cough, sputum production.  Outpatient Encounter Prescriptions as of 06/27/2017  Medication Sig  . acetaminophen (TYLENOL) 500 MG tablet Take 500 mg by mouth every 6 (six) hours as needed.  Alveda Reasons 20 MG TABS tablet TAKE 1 TABLET (20 MG TOTAL) BY MOUTH DAILY WITH SUPPER.   No facility-administered encounter medications on file as of 06/27/2017.     Allergies as of 06/27/2017  . (No Known Allergies)    Past Medical History:  Diagnosis Date  . Full-thickness skin loss due to burn (third degree NOS), unspecified site   . Morbid obesity ([place])   . Numbness   . Weakness     Past Surgical History:  Procedure Laterality Date  . CHOLECYSTECTOMY    . FRACTURE SURGERY    . GALLBLADDER SURGERY     2004    No family history on file.  Social History   Social History  . Marital status: Married    Spouse name: N/A  . Number of children: N/A  . Years of education: N/A   Occupational History  . Not on file.   Social History Main Topics  . Smoking status:  Never Smoker  . Smokeless tobacco: Never Used  . Alcohol use Yes  . Drug use: No  . Sexual activity: Not on file   Other Topics Concern  . Not on file   Social History Narrative  . No narrative on file   Review of systems: Review of Systems  Constitutional: Negative for fever and chills.  [name]: Negative.   Eyes: Negative for blurred vision.  Respiratory: as per HPI  Cardiovascular: Negative for chest pain and palpitations.  Gastrointestinal: Negative for vomiting, diarrhea, blood per rectum. Genitourinary: Negative for dysuria, urgency, frequency and hematuria.  Musculoskeletal: Negative for myalgias, back pain and joint pain.  Skin: Negative for itching and rash.  Neurological: Negative for dizziness, tremors, focal weakness, seizures and loss of consciousness.  Endo/Heme/Allergies: Negative for environmental allergies.  Psychiatric/Behavioral: Negative for depression, suicidal ideas and hallucinations.  All other systems reviewed and are negative.  Physical Exam: Blood pressure 126/78, pulse 80, height 5\' 5"  (1.651 m), weight 232 lb 9.6 oz (105.5 kg), SpO2 97 %. Gen:      No acute distress HEENT:  EOMI, sclera anicteric Neck:     No masses; no thyromegaly Lungs:    Clear to auscultation bilaterally; normal respiratory effort CV:         Regular rate and rhythm; no murmurs Abd:      +  bowel sounds; soft, non-tender; no palpable masses, no distension Ext:    No edema; adequate peripheral perfusion Skin:      Warm and dry; no rash Neuro: alert and oriented x 3 Psych: normal mood and affect  Data Reviewed: CT chest 03/20/00- Pulmonary emboli associated with right lower lung field in particular. There is also small emboli and atelectasis associated with left lung base. CTA 11/28/16-tiny nonocclusive filling defect in the right lower lobe segmental vessel. All images personally reviewed.  BMET    Component Value Date/Time   NA 139 11/27/2016 2024   K 3.8 11/27/2016 2024    CL 105 11/27/2016 2024   CO2 23 11/27/2016 2024   GLUCOSE 102 (H) 11/27/2016 2024   BUN 12 11/27/2016 2024   CREATININE 1.20 11/27/2016 2024   CREATININE 1.29 11/18/2014 0836   CALCIUM 9.7 11/27/2016 2024   GFRNONAA >60 11/27/2016 2024   GFRAA >60 11/27/2016 2024   CBC    Component Value Date/Time   WBC 11.3 (H) 11/27/2016 2024   RBC 5.66 11/27/2016 2024   HGB 16.0 11/27/2016 2024   HCT 46.9 11/27/2016 2024   PLT 215 11/27/2016 2024   MCV 82.9 11/27/2016 2024   MCH 28.3 11/27/2016 2024   MCHC 34.1 11/27/2016 2024   RDW 13.2 11/27/2016 2024    Assessment:  Jerome Lee is here for evaluation of pulmonary embolus without right heart strain. I personally reviewed the images and the clot appears very small. This could very well represent a contrast artifact or a residual of clot from his prior pulmonary embolism. The filling defect is in the right lower lobe which seems to be the area of his prior pulmonary embolus.  He is concerned about increased risk of bleeding with continued Xarelto. I will review his latest CT scan and compare to prior CT scan with the radiologist. If we feel this is just residual clot, web from prior episode then I would be more comfortable in stopping the xarelto.    Plan/Recommendations: - Continue xarelto - Review scan with radiologist.  Follow up in 6 months.  Marshell Garfinkel MD [facility] Pager [phone] If no answer or after 3pm call: [phone] 06/27/2017, 2:00 PM   CC: Copland, Gay Filler, MD   Addendum: Reviewed CT scan with radiologist. The RLL filling defect looks like a web and could be residual PE he had around 2000 (see addendum in CT report) I feel he can come off the anticoagulation and will call and discuss with the patient.  Marshell Garfinkel MD [facility] 07/29/2017, 9:02 PM

## 2017-07-09 ENCOUNTER — Other Ambulatory Visit: Payer: Self-pay | Admitting: Pulmonary Disease

## 2017-07-19 ENCOUNTER — Telehealth: Payer: Self-pay | Admitting: Pulmonary Disease

## 2017-07-19 NOTE — Telephone Encounter (Signed)
I have spoken with pt. Pt states he is returning Dr. Matilde Bash call. Pt request a call back at [phone]    PM please advise. Thanks.

## 2017-07-21 NOTE — Telephone Encounter (Signed)
I called this number [phone]) and got his sister who said Jerome Lee does not live there. I call the other number on record [phone] and left message requesting call back.  Marshell Garfinkel MD [facility] Pager [phone] If no answer or after 3pm call: [phone] 07/21/2017, 3:49 PM

## 2017-08-09 ENCOUNTER — Other Ambulatory Visit: Payer: Self-pay | Admitting: Pulmonary Disease

## 2017-09-08 ENCOUNTER — Other Ambulatory Visit: Payer: Self-pay | Admitting: Pulmonary Disease

## 2017-10-08 ENCOUNTER — Other Ambulatory Visit: Payer: Self-pay | Admitting: Pulmonary Disease

## 2017-11-11 ENCOUNTER — Other Ambulatory Visit: Payer: Self-pay | Admitting: Pulmonary Disease

## 2017-11-14 ENCOUNTER — Telehealth: Payer: Self-pay

## 2017-11-14 ENCOUNTER — Other Ambulatory Visit: Payer: Self-pay | Admitting: Pulmonary Disease

## 2017-11-14 MED ORDER — RIVAROXABAN 20 MG PO TABS: ORAL_TABLET | ORAL | 0 refills | Status: DC

## 2017-11-14 NOTE — Telephone Encounter (Signed)
Patient is needing 90 day supply for xarelto. New prescription sent in.

## 2017-12-27 ENCOUNTER — Ambulatory Visit: Payer: BC Managed Care – PPO | Admitting: Pulmonary Disease

## 2017-12-27 ENCOUNTER — Encounter: Payer: Self-pay | Admitting: Pulmonary Disease

## 2017-12-27 VITALS — BP 136/68 | HR 76 | Ht 65.5 in | Wt 229.8 lb

## 2017-12-27 DIAGNOSIS — I2699 Other pulmonary embolism without acute cor pulmonale: Secondary | ICD-10-CM

## 2017-12-27 NOTE — Progress Notes (Signed)
Jerome Lee    [id number]    [birth year]  Primary Care Physician:Copland, Gay Filler, MD  Referring Physician: Darreld Mclean, [address] [place], [place] [address]  Chief complaint:  Follow up for PE  HPI: Jerome Lee is a 66 year old with past medical history of DVT, PE in 2001. He was evaluated in the ED in mid December 2017 for acute onset chest pain, dyspnea. He underwent a CTA which showed a very small nonocclusive filling defect in the right lower lobe pulmonary artery. Patient was stable with no evidence of right heart strain, troponins were negative. He was not hypoxic or tachycardic. He was started on Xarelto and discharged. His DVT PE in 2003 occurred in the setting of left ankle fracture. This time around he denies any immobilization, prolonged air travel. There is no family history of blood clots.  Here for follow-up. No new complaints.  Denies any cough, sputum production, dyspnea  Outpatient Encounter Medications as of 12/27/2017  Medication Sig  . acetaminophen (TYLENOL) 500 MG tablet Take 500 mg by mouth every 6 (six) hours as needed.  . rivaroxaban (XARELTO) 20 MG TABS tablet TAKE 1 TABLET EVERY DAY WITH SUPPER   No facility-administered encounter medications on file as of 12/27/2017.     Allergies as of 12/27/2017  . (No Known Allergies)    Past Medical History:  Diagnosis Date  . Full-thickness skin loss due to burn (third degree NOS), unspecified site   . Morbid obesity ([place])   . Numbness   . Weakness     Past Surgical History:  Procedure Laterality Date  . CHOLECYSTECTOMY    . FRACTURE SURGERY    . GALLBLADDER SURGERY     2004    No family history on file.  Social History   Socioeconomic History  . Marital status: Married    Spouse name: Not on file  . Number of children: Not on file  . Years of education: Not on file  . Highest education level: Not on file  Social Needs  . Financial resource strain: Not  on file  . Food insecurity - worry: Not on file  . Food insecurity - inability: Not on file  . Transportation needs - medical: Not on file  . Transportation needs - non-medical: Not on file  Occupational History  . Not on file  Tobacco Use  . Smoking status: Never Smoker  . Smokeless tobacco: Never Used  Substance and Sexual Activity  . Alcohol use: Yes  . Drug use: No  . Sexual activity: Not on file  Other Topics Concern  . Not on file  Social History Narrative  . Not on file   Review of systems: Review of Systems  Constitutional: Negative for fever and chills.  [name]: Negative.   Eyes: Negative for blurred vision.  Respiratory: as per HPI  Cardiovascular: Negative for chest pain and palpitations.  Gastrointestinal: Negative for vomiting, diarrhea, blood per rectum. Genitourinary: Negative for dysuria, urgency, frequency and hematuria.  Musculoskeletal: Negative for myalgias, back pain and joint pain.  Skin: Negative for itching and rash.  Neurological: Negative for dizziness, tremors, focal weakness, seizures and loss of consciousness.  Endo/Heme/Allergies: Negative for environmental allergies.  Psychiatric/Behavioral: Negative for depression, suicidal ideas and hallucinations.  All other systems reviewed and are negative.  Physical Exam: Blood pressure 136/68, pulse 76, height 5' 5.5" (1.664 m), weight 229 lb 12.8 oz (104.2 kg), SpO2 98 %.  Gen:      No acute distress HEENT:  EOMI, sclera anicteric Neck:     No masses; no thyromegaly Lungs:    Clear to auscultation bilaterally; normal respiratory effort CV:         Regular rate and rhythm; no murmurs Abd:      + bowel sounds; soft, non-tender; no palpable masses, no distension Ext:    No edema; adequate peripheral perfusion Skin:      Warm and dry; no rash Neuro: alert and oriented x 3 Psych: normal mood and affect  Data Reviewed: CT chest 03/20/00- Pulmonary emboli associated with right lower lung field in particular.  There is also small emboli and atelectasis associated with left lung base. CTA 11/28/16-tiny nonocclusive filling defect in the right lower lobe segmental vessel. All images personally reviewed.  BMET    Component Value Date/Time   NA 139 11/27/2016 2024   K 3.8 11/27/2016 2024   CL 105 11/27/2016 2024   CO2 23 11/27/2016 2024   GLUCOSE 102 (H) 11/27/2016 2024   BUN 12 11/27/2016 2024   CREATININE 1.20 11/27/2016 2024   CREATININE 1.29 11/18/2014 0836   CALCIUM 9.7 11/27/2016 2024   GFRNONAA >60 11/27/2016 2024   GFRAA >60 11/27/2016 2024   CBC    Component Value Date/Time   WBC 11.3 (H) 11/27/2016 2024   RBC 5.66 11/27/2016 2024   HGB 16.0 11/27/2016 2024   HCT 46.9 11/27/2016 2024   PLT 215 11/27/2016 2024   MCV 82.9 11/27/2016 2024   MCH 28.3 11/27/2016 2024   MCHC 34.1 11/27/2016 2024   RDW 13.2 11/27/2016 2024    Assessment:  Jerome Lee is here for evaluation of pulmonary embolus without right heart strain. I personally reviewed the images and the clot appears very small. This could very well represent a contrast artifact or a residual of clot from his prior pulmonary embolism. The filling defect is in the right lower lobe which seems to be the area of his prior pulmonary embolus.  He is concerned about increased risk of bleeding with continued Xarelto.  Reviewed CT scan with radiologist. The RLL filling defect looks like a web and could be residual PE he had around 2000 (see addendum in CT report) I feel he can come off the anticoagulation.  Risk benefit of continuing therapy versus stopping was discussed with the patient and we decided to stop the Xarelto He knows to go to the emergency room if he develops any chest pain, shortness of breath.  Plan/Recommendations: - Stop Xarelto Follow-up in 1 year.  More then 1/2 the time of the 40 min visit was spent in counseling and/or coordination of care with the patient and family.  Marshell Garfinkel MD [facility] Pager [phone] If no answer or after 3pm call: [phone] 12/27/2017, 11:12 AM

## 2017-12-27 NOTE — Patient Instructions (Signed)
I feel you can come off the Xarelto anticoagulant Please monitor your symptoms and go to the emergency room if you have shortness of breath chest pain Follow-up in 1 year.

## 2018-03-20 ENCOUNTER — Encounter: Payer: Self-pay | Admitting: Emergency Medicine

## 2018-03-20 ENCOUNTER — Ambulatory Visit (INDEPENDENT_AMBULATORY_CARE_PROVIDER_SITE_OTHER): Payer: BC Managed Care – PPO

## 2018-03-20 ENCOUNTER — Ambulatory Visit: Payer: BC Managed Care – PPO | Admitting: Emergency Medicine

## 2018-03-20 ENCOUNTER — Other Ambulatory Visit: Payer: Self-pay

## 2018-03-20 VITALS — BP 134/80 | HR 75 | Temp 98.8°F | Resp 16 | Ht 64.0 in | Wt 231.2 lb

## 2018-03-20 DIAGNOSIS — M25511 Pain in right shoulder: Secondary | ICD-10-CM

## 2018-03-20 DIAGNOSIS — M19011 Primary osteoarthritis, right shoulder: Secondary | ICD-10-CM

## 2018-03-20 MED ORDER — DICLOFENAC SODIUM 75 MG PO TBEC: 75.0000 mg | DELAYED_RELEASE_TABLET | Freq: Two times a day (BID) | ORAL | 0 refills | Status: AC

## 2018-03-20 NOTE — Patient Instructions (Addendum)
IF you received an x-ray today, you will receive an invoice from [facility]. Please contact [facility] at [phone] with questions or concerns regarding your invoice.   IF you received labwork today, you will receive an invoice from [place]. Please contact [facility] at [phone] with questions or concerns regarding your invoice.   Our billing staff will not be able to assist you with questions regarding bills from these companies.  You will be contacted with the lab results as soon as they are available. The fastest way to get your results is to activate your My Chart account. Instructions are located on the last page of this paperwork. If you have not heard from [place] regarding the results in 2 weeks, please contact this office.     Shoulder Pain Many things can cause shoulder pain, including:  An injury.  Moving the arm in the same way again and again (overuse).  Joint pain (arthritis).  Follow these instructions at home: Take these actions to help with your pain:  Squeeze a soft ball or a foam pad as much as you can. This helps to prevent swelling. It also makes the arm stronger.  Take over-the-counter and prescription medicines only as told by your doctor.  If told, put ice on the area: ? Put ice in a plastic bag. ? Place a towel between your skin and the bag. ? Leave the ice on for 20 minutes, 2-3 times per day. Stop putting on ice if it does not help with the pain.  If you were given a shoulder sling or immobilizer: ? Wear it as told. ? Remove it to shower or bathe. ? Move your arm as little as possible. ? Keep your hand moving. This helps prevent swelling.  Contact a doctor if:  Your pain gets worse.  Medicine does not help your pain.  You have new pain in your arm, hand, or fingers. Get help right away if:  Your arm, hand, or fingers: ? Tingle. ? Are numb. ? Are swollen. ? Are painful. ? Turn white or blue. This information is  not intended to replace advice given to you by your health care provider. Make sure you discuss any questions you have with your health care provider. Document Released: 05/17/2008 Document Revised: 07/25/2016 Document Reviewed: 03/24/2015 Elsevier Interactive Patient Education  2018 [employer].  Osteoarthritis Osteoarthritis is a type of arthritis that affects tissue that covers the ends of bones in joints (cartilage). Cartilage acts as a cushion between the bones and helps them move smoothly. Osteoarthritis results when cartilage in the joints gets worn down. Osteoarthritis is sometimes called "wear and tear" arthritis. Osteoarthritis is the most common form of arthritis. It often occurs in older people. It is a condition that gets worse over time (a progressive condition). Joints that are most often affected by this condition are in:  Fingers.  Toes.  Hips.  Knees.  Spine, including neck and lower back.  What are the causes? This condition is caused by age-related wearing down of cartilage that covers the ends of bones. What increases the risk? The following factors may make you more likely to develop this condition:  Older age.  Being overweight or obese.  Overuse of joints, such as in athletes.  Past injury of a joint.  Past surgery on a joint.  Family history of osteoarthritis.  What are the signs or symptoms? The main symptoms of this condition are pain, swelling, and stiffness in the joint. The joint  may lose its shape over time. Small pieces of bone or cartilage may break off and float inside of the joint, which may cause more pain and damage to the joint. Small deposits of bone (osteophytes) may grow on the edges of the joint. Other symptoms may include:  A grating or scraping feeling inside the joint when you move it.  Popping or creaking sounds when you move.  Symptoms may affect one or more joints. Osteoarthritis in a major joint, such as your knee or hip,  can make it painful to walk or exercise. If you have osteoarthritis in your hands, you might not be able to grip items, twist your hand, or control small movements of your hands and fingers (fine motor skills). How is this diagnosed? This condition may be diagnosed based on:  Your medical history.  A physical exam.  Your symptoms.  X-rays of the affected joint(s).  Blood tests to rule out other types of arthritis.  How is this treated? There is no cure for this condition, but treatment can help to control pain and improve joint function. Treatment plans may include:  A prescribed exercise program that allows for rest and joint relief. You may work with a physical therapist.  A weight control plan.  Pain relief techniques, such as: ? Applying heat and cold to the joint. ? Electric pulses delivered to nerve endings under the skin (transcutaneous electrical nerve stimulation, or TENS). ? Massage. ? Certain nutritional supplements.  NSAIDs or prescription medicines to help relieve pain.  Medicine to help relieve pain and inflammation (corticosteroids). This can be given by mouth (orally) or as an injection.  Assistive devices, such as a brace, wrap, splint, specialized glove, or cane.  Surgery, such as: ? An osteotomy. This is done to reposition the bones and relieve pain or to remove loose pieces of bone and cartilage. ? Joint replacement surgery. You may need this surgery if you have very bad (advanced) osteoarthritis.  Follow these instructions at home: Activity  Rest your affected joints as directed by your health care provider.  Do not drive or use heavy machinery while taking prescription pain medicine.  Exercise as directed. Your health care provider or physical therapist may recommend specific types of exercise, such as: ? Strengthening exercises. These are done to strengthen the muscles that support joints that are affected by arthritis. They can be performed with  weights or with exercise bands to add resistance. ? Aerobic activities. These are exercises, such as brisk walking or water aerobics, that get your heart pumping. ? Range-of-motion activities. These keep your joints easy to move. ? Balance and agility exercises. Managing pain, stiffness, and swelling  If directed, apply heat to the affected area as often as told by your health care provider. Use the heat source that your health care provider recommends, such as a moist heat pack or a heating pad. ? If you have a removable assistive device, remove it as told by your health care provider. ? Place a towel between your skin and the heat source. If your health care provider tells you to keep the assistive device on while you apply heat, place a towel between the assistive device and the heat source. ? Leave the heat on for 20-30 minutes. ? Remove the heat if your skin turns bright red. This is especially important if you are unable to feel pain, heat, or cold. You may have a greater risk of getting burned.  If directed, put ice on the affected  joint: ? If you have a removable assistive device, remove it as told by your health care provider. ? Put ice in a plastic bag. ? Place a towel between your skin and the bag. If your health care provider tells you to keep the assistive device on during icing, place a towel between the assistive device and the bag. ? Leave the ice on for 20 minutes, 2-3 times a day. General instructions  Take over-the-counter and prescription medicines only as told by your health care provider.  Maintain a healthy weight. Follow instructions from your health care provider for weight control. These may include dietary restrictions.  Do not use any products that contain nicotine or tobacco, such as cigarettes and e-cigarettes. These can delay bone healing. If you need help quitting, ask your health care provider.  Use assistive devices as directed by your health care  provider.  Keep all follow-up visits as told by your health care provider. This is important. Where to find more information:  [employer] of Arthritis and Musculoskeletal and Skin Diseases: www.niams.SouthExposed.es  [employer] on Aging: http://kim-miller.com/  American College of Rheumatology: www.rheumatology.org Contact a health care provider if:  Your skin turns red.  You develop a rash.  You have pain that gets worse.  You have a fever along with joint or muscle aches. Get help right away if:  You lose a lot of weight.  You suddenly lose your appetite.  You have night sweats. Summary  Osteoarthritis is a type of arthritis that affects tissue covering the ends of bones in joints (cartilage).  This condition is caused by age-related wearing down of cartilage that covers the ends of bones.  The main symptom of this condition is pain, swelling, and stiffness in the joint.  There is no cure for this condition, but treatment can help to control pain and improve joint function. This information is not intended to replace advice given to you by your health care provider. Make sure you discuss any questions you have with your health care provider. Document Released: 11/29/2005 Document Revised: 08/02/2016 Document Reviewed: 08/02/2016 Elsevier Interactive Patient Education  [employer].

## 2018-03-20 NOTE — Progress Notes (Signed)
Jerome Lee 67 y.o.   Chief Complaint  Patient presents with  . Shoulder pain    right,x 3 days    HISTORY OF PRESENT ILLNESS: This is a 67 y.o. male complaining of right shoulder pain for 3 days. Slowly improving. Denies injury.  Denies any other significant symptoms.  Has been taking Tylenol and states he feels little better.  HPI   Prior to Admission medications   Medication Sig Start Date End Date Taking? Authorizing Provider  acetaminophen (TYLENOL) 500 MG tablet Take 500 mg by mouth every 6 (six) hours as needed.   Yes [provider]  aspirin 81 MG tablet Take 81 mg by mouth daily.   Yes [provider]    No Known Allergies  Patient Active Problem List   Diagnosis Date Noted  . Full-thickness skin loss due to burn (third degree NOS), unspecified site   . Morbid obesity ([place])   . Weakness   . Numbness     Past Medical History:  Diagnosis Date  . Full-thickness skin loss due to burn (third degree NOS), unspecified site   . Morbid obesity ([place])   . Numbness   . Weakness     Past Surgical History:  Procedure Laterality Date  . CHOLECYSTECTOMY    . FRACTURE SURGERY    . GALLBLADDER SURGERY     2004    Social History   Socioeconomic History  . Marital status: Married    Spouse name: Not on file  . Number of children: Not on file  . Years of education: Not on file  . Highest education level: Not on file  Occupational History  . Not on file  Social Needs  . Financial resource strain: Not on file  . Food insecurity:    Worry: Not on file    Inability: Not on file  . Transportation needs:    Medical: Not on file    Non-medical: Not on file  Tobacco Use  . Smoking status: Never Smoker  . Smokeless tobacco: Never Used  Substance and Sexual Activity  . Alcohol use: Yes  . Drug use: No  . Sexual activity: Not on file  Lifestyle  . Physical activity:    Days per week: Not on file    Minutes per session: Not on file  . Stress:  Not on file  Relationships  . Social connections:    Talks on phone: Not on file    Gets together: Not on file    Attends religious service: Not on file    Active member of club or organization: Not on file    Attends meetings of clubs or organizations: Not on file    Relationship status: Not on file  . Intimate partner violence:    Fear of current or ex partner: Not on file    Emotionally abused: Not on file    Physically abused: Not on file    Forced sexual activity: Not on file  Other Topics Concern  . Not on file  Social History Narrative  . Not on file    No family history on file.   Review of Systems  Constitutional: Negative.  Negative for chills and fever.  [name]: Negative.  Negative for sore throat.   Eyes: Negative.   Respiratory: Negative.  Negative for cough, sputum production and shortness of breath.   Cardiovascular: Negative.  Negative for chest pain, palpitations and leg swelling.  Gastrointestinal: Negative.  Negative for abdominal pain, heartburn, nausea and  vomiting.  Genitourinary: Negative.   Musculoskeletal: Positive for joint pain (Right shoulder pain).  Skin: Negative.  Negative for rash.  Neurological: Negative.  Negative for dizziness, sensory change, focal weakness and headaches.  Endo/Heme/Allergies: Negative.   All other systems reviewed and are negative.   Vitals:   03/20/18 1212  BP: 134/80  Pulse: 75  Resp: 16  Temp: 98.8 F (37.1 C)  SpO2: 97%    Physical Exam  Constitutional: He is oriented to person, place, and time. He appears well-developed and well-nourished.  [name]:  Head: Normocephalic and atraumatic.  Nose: Nose normal.  Mouth/Throat: Oropharynx is clear and moist.  Eyes: Pupils are equal, round, and reactive to light. Conjunctivae and EOM are normal.  Neck: Normal range of motion. Neck supple. No JVD present.  Cardiovascular: Normal rate, regular rhythm and normal heart sounds.  Pulmonary/Chest: Effort normal and breath  sounds normal.  Abdominal: Soft. There is no tenderness.  Musculoskeletal:  Right shoulder: No bruising or erythema.  No swelling.  Some tenderness to palpation.  Very limited range of motion due to pain. Right upper extremity: NVI.  Within normal limits distally.  Lymphadenopathy:    He has no cervical adenopathy.  Neurological: He is alert and oriented to person, place, and time. No sensory deficit. He exhibits normal muscle tone.  Skin: Skin is warm and dry. Capillary refill takes less than 2 seconds.  Psychiatric: He has a normal mood and affect. His behavior is normal.  Vitals reviewed.   Dg Shoulder Right  Result Date: 03/20/2018 CLINICAL DATA:  Acute pain EXAM: RIGHT SHOULDER - 2+ VIEW COMPARISON:  None. FINDINGS: Frontal, Y scapular, and axillary images were obtained. No fracture or dislocation. There is moderate generalized osteoarthritic change. No erosive change or intra-articular calcification. Visualized right lung is clear. IMPRESSION: Moderate osteoarthritic change.  No fracture or dislocation. Electronically Signed   By: Lowella Grip III M.D.   On: 03/20/2018 12:43   X-ray reviewed with patient in the room.  Radiologist report noted. A tota2l of 25 minutes was spent in the room with the patient, greater than 50% of which was in counseling/coordination of care.  ASSESSMENT & PLAN: Jerome Lee was seen today for shoulder pain.  Diagnoses and all orders for this visit:  Acute pain of right shoulder -     diclofenac (VOLTAREN) 75 MG EC tablet; Take 1 tablet (75 mg total) by mouth 2 (two) times daily for 5 days. -     DG Shoulder Right; Future  Primary osteoarthritis of right shoulder    Patient Instructions       IF you received an x-ray today, you will receive an invoice from [facility]. Please contact [facility] at [phone] with questions or concerns regarding your invoice.   IF you received labwork today, you will receive an invoice from  [place]. Please contact [facility] at [phone] with questions or concerns regarding your invoice.   Our billing staff will not be able to assist you with questions regarding bills from these companies.  You will be contacted with the lab results as soon as they are available. The fastest way to get your results is to activate your My Chart account. Instructions are located on the last page of this paperwork. If you have not heard from [place] regarding the results in 2 weeks, please contact this office.     Shoulder Pain Many things can cause shoulder pain, including:  An injury.  Moving the arm in the same way again  and again (overuse).  Joint pain (arthritis).  Follow these instructions at home: Take these actions to help with your pain:  Squeeze a soft ball or a foam pad as much as you can. This helps to prevent swelling. It also makes the arm stronger.  Take over-the-counter and prescription medicines only as told by your doctor.  If told, put ice on the area: ? Put ice in a plastic bag. ? Place a towel between your skin and the bag. ? Leave the ice on for 20 minutes, 2-3 times per day. Stop putting on ice if it does not help with the pain.  If you were given a shoulder sling or immobilizer: ? Wear it as told. ? Remove it to shower or bathe. ? Move your arm as little as possible. ? Keep your hand moving. This helps prevent swelling.  Contact a doctor if:  Your pain gets worse.  Medicine does not help your pain.  You have new pain in your arm, hand, or fingers. Get help right away if:  Your arm, hand, or fingers: ? Tingle. ? Are numb. ? Are swollen. ? Are painful. ? Turn white or blue. This information is not intended to replace advice given to you by your health care provider. Make sure you discuss any questions you have with your health care provider. Document Released: 05/17/2008 Document Revised: 07/25/2016 Document Reviewed: 03/24/2015 Elsevier Interactive  Patient Education  2018 [employer].  Osteoarthritis Osteoarthritis is a type of arthritis that affects tissue that covers the ends of bones in joints (cartilage). Cartilage acts as a cushion between the bones and helps them move smoothly. Osteoarthritis results when cartilage in the joints gets worn down. Osteoarthritis is sometimes called "wear and tear" arthritis. Osteoarthritis is the most common form of arthritis. It often occurs in older people. It is a condition that gets worse over time (a progressive condition). Joints that are most often affected by this condition are in:  Fingers.  Toes.  Hips.  Knees.  Spine, including neck and lower back.  What are the causes? This condition is caused by age-related wearing down of cartilage that covers the ends of bones. What increases the risk? The following factors may make you more likely to develop this condition:  Older age.  Being overweight or obese.  Overuse of joints, such as in athletes.  Past injury of a joint.  Past surgery on a joint.  Family history of osteoarthritis.  What are the signs or symptoms? The main symptoms of this condition are pain, swelling, and stiffness in the joint. The joint may lose its shape over time. Small pieces of bone or cartilage may break off and float inside of the joint, which may cause more pain and damage to the joint. Small deposits of bone (osteophytes) may grow on the edges of the joint. Other symptoms may include:  A grating or scraping feeling inside the joint when you move it.  Popping or creaking sounds when you move.  Symptoms may affect one or more joints. Osteoarthritis in a major joint, such as your knee or hip, can make it painful to walk or exercise. If you have osteoarthritis in your hands, you might not be able to grip items, twist your hand, or control small movements of your hands and fingers (fine motor skills). How is this diagnosed? This condition may be  diagnosed based on:  Your medical history.  A physical exam.  Your symptoms.  X-rays of the affected joint(s).  Blood tests to rule out other types of arthritis.  How is this treated? There is no cure for this condition, but treatment can help to control pain and improve joint function. Treatment plans may include:  A prescribed exercise program that allows for rest and joint relief. You may work with a physical therapist.  A weight control plan.  Pain relief techniques, such as: ? Applying heat and cold to the joint. ? Electric pulses delivered to nerve endings under the skin (transcutaneous electrical nerve stimulation, or TENS). ? Massage. ? Certain nutritional supplements.  NSAIDs or prescription medicines to help relieve pain.  Medicine to help relieve pain and inflammation (corticosteroids). This can be given by mouth (orally) or as an injection.  Assistive devices, such as a brace, wrap, splint, specialized glove, or cane.  Surgery, such as: ? An osteotomy. This is done to reposition the bones and relieve pain or to remove loose pieces of bone and cartilage. ? Joint replacement surgery. You may need this surgery if you have very bad (advanced) osteoarthritis.  Follow these instructions at home: Activity  Rest your affected joints as directed by your health care provider.  Do not drive or use heavy machinery while taking prescription pain medicine.  Exercise as directed. Your health care provider or physical therapist may recommend specific types of exercise, such as: ? Strengthening exercises. These are done to strengthen the muscles that support joints that are affected by arthritis. They can be performed with weights or with exercise bands to add resistance. ? Aerobic activities. These are exercises, such as brisk walking or water aerobics, that get your heart pumping. ? Range-of-motion activities. These keep your joints easy to move. ? Balance and agility  exercises. Managing pain, stiffness, and swelling  If directed, apply heat to the affected area as often as told by your health care provider. Use the heat source that your health care provider recommends, such as a moist heat pack or a heating pad. ? If you have a removable assistive device, remove it as told by your health care provider. ? Place a towel between your skin and the heat source. If your health care provider tells you to keep the assistive device on while you apply heat, place a towel between the assistive device and the heat source. ? Leave the heat on for 20-30 minutes. ? Remove the heat if your skin turns bright red. This is especially important if you are unable to feel pain, heat, or cold. You may have a greater risk of getting burned.  If directed, put ice on the affected joint: ? If you have a removable assistive device, remove it as told by your health care provider. ? Put ice in a plastic bag. ? Place a towel between your skin and the bag. If your health care provider tells you to keep the assistive device on during icing, place a towel between the assistive device and the bag. ? Leave the ice on for 20 minutes, 2-3 times a day. General instructions  Take over-the-counter and prescription medicines only as told by your health care provider.  Maintain a healthy weight. Follow instructions from your health care provider for weight control. These may include dietary restrictions.  Do not use any products that contain nicotine or tobacco, such as cigarettes and e-cigarettes. These can delay bone healing. If you need help quitting, ask your health care provider.  Use assistive devices as directed by your health care provider.  Keep all follow-up visits as  told by your health care provider. This is important. Where to find more information:  [employer] of Arthritis and Musculoskeletal and Skin Diseases: www.niams.SouthExposed.es  [employer] on Aging:  http://kim-miller.com/  American College of Rheumatology: www.rheumatology.org Contact a health care provider if:  Your skin turns red.  You develop a rash.  You have pain that gets worse.  You have a fever along with joint or muscle aches. Get help right away if:  You lose a lot of weight.  You suddenly lose your appetite.  You have night sweats. Summary  Osteoarthritis is a type of arthritis that affects tissue covering the ends of bones in joints (cartilage).  This condition is caused by age-related wearing down of cartilage that covers the ends of bones.  The main symptom of this condition is pain, swelling, and stiffness in the joint.  There is no cure for this condition, but treatment can help to control pain and improve joint function. This information is not intended to replace advice given to you by your health care provider. Make sure you discuss any questions you have with your health care provider. Document Released: 11/29/2005 Document Revised: 08/02/2016 Document Reviewed: 08/02/2016 Elsevier Interactive Patient Education  2018 [employer]      Agustina Caroli, MD [facility]

## 2018-03-31 ENCOUNTER — Telehealth: Payer: Self-pay

## 2018-03-31 NOTE — Telephone Encounter (Signed)
Per visit notes pt needs office visit if symptoms fail to improve. Please schedule.   Copied from [place] [phone]. Topic: Quick Communication - See Telephone Encounter >> Mar 31, 2018 10:06 AM Antonieta Iba C wrote: CRM for notification. See Telephone encounter for: 03/31/18.  Pt says that his shoulder pain has come back. Pt would like a refill on medication diclofenac (VOLTAREN) 75 MG EC tablet   Pharmacy: [facility] [address] [place] - [address]

## 2018-04-04 NOTE — Telephone Encounter (Signed)
Will send a mychart message to pt letting them know they need an OV.

## 2018-04-14 ENCOUNTER — Ambulatory Visit: Payer: BC Managed Care – PPO | Admitting: Urgent Care

## 2018-04-14 ENCOUNTER — Ambulatory Visit (INDEPENDENT_AMBULATORY_CARE_PROVIDER_SITE_OTHER): Payer: BC Managed Care – PPO | Admitting: Emergency Medicine

## 2018-04-14 ENCOUNTER — Encounter: Payer: Self-pay | Admitting: Emergency Medicine

## 2018-04-14 ENCOUNTER — Other Ambulatory Visit: Payer: Self-pay

## 2018-04-14 VITALS — BP 146/80 | HR 88 | Temp 98.7°F | Resp 16 | Ht 64.25 in | Wt 234.8 lb

## 2018-04-14 DIAGNOSIS — M19011 Primary osteoarthritis, right shoulder: Secondary | ICD-10-CM | POA: Diagnosis not present

## 2018-04-14 DIAGNOSIS — M25511 Pain in right shoulder: Secondary | ICD-10-CM

## 2018-04-14 MED ORDER — DICLOFENAC SODIUM 75 MG PO TBEC: 75.0000 mg | DELAYED_RELEASE_TABLET | Freq: Two times a day (BID) | ORAL | 1 refills | Status: AC
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
## 2018-04-14 NOTE — Patient Instructions (Addendum)
     IF you received an x-ray today, you will receive an invoice from [facility]. Please contact [facility] at [phone] with questions or concerns regarding your invoice.   IF you received labwork today, you will receive an invoice from [facility]. Please contact [facility] at [phone] with questions or concerns regarding your invoice.   Our billing staff will not be able to assist you with questions regarding bills from these companies.  You will be contacted with the lab results as soon as they are available. The fastest way to get your results is to activate your My Chart account. Instructions are located on the last page of this paperwork. If you have not heard from us regarding the results in 2 weeks, please contact this office.     Shoulder Pain Many things can cause shoulder pain, including:  An injury.  Moving the arm in the same way again and again (overuse).  Joint pain (arthritis).  Follow these instructions at home: Take these actions to help with your pain:  Squeeze a soft ball or a foam pad as much as you can. This helps to prevent swelling. It also makes the arm stronger.  Take over-the-counter and prescription medicines only as told by your doctor.  If told, put ice on the area: ? Put ice in a plastic bag. ? Place a towel between your skin and the bag. ? Leave the ice on for 20 minutes, 2-3 times per day. Stop putting on ice if it does not help with the pain.  If you were given a shoulder sling or immobilizer: ? Wear it as told. ? Remove it to shower or bathe. ? Move your arm as little as possible. ? Keep your hand moving. This helps prevent swelling.  Contact a doctor if:  Your pain gets worse.  Medicine does not help your pain.  You have new pain in your arm, hand, or fingers. Get help right away if:  Your arm, hand, or fingers: ? Tingle. ? Are numb. ? Are swollen. ? Are painful. ? Turn white or blue. This information is  not intended to replace advice given to you by your health care provider. Make sure you discuss any questions you have with your health care provider. Document Released: 05/17/2008 Document Revised: 07/25/2016 Document Reviewed: 03/24/2015 Elsevier Interactive Patient Education  2018 [employer]  

## 2018-08-24 ENCOUNTER — Other Ambulatory Visit: Payer: Self-pay

## 2018-08-24 ENCOUNTER — Encounter: Payer: Self-pay | Admitting: Emergency Medicine

## 2018-08-24 ENCOUNTER — Ambulatory Visit (INDEPENDENT_AMBULATORY_CARE_PROVIDER_SITE_OTHER): Payer: BC Managed Care – PPO | Admitting: Emergency Medicine

## 2018-08-24 VITALS — BP 130/84 | HR 79 | Temp 98.5°F | Resp 16 | Ht 64.25 in | Wt 233.6 lb

## 2018-08-24 DIAGNOSIS — M25512 Pain in left shoulder: Secondary | ICD-10-CM | POA: Diagnosis not present

## 2018-08-24 DIAGNOSIS — M159 Polyosteoarthritis, unspecified: Secondary | ICD-10-CM

## 2018-08-24 DIAGNOSIS — M150 Primary generalized (osteo)arthritis: Secondary | ICD-10-CM

## 2018-08-24 DIAGNOSIS — M25511 Pain in right shoulder: Secondary | ICD-10-CM | POA: Diagnosis not present

## 2018-08-24 DIAGNOSIS — Z23 Encounter for immunization: Secondary | ICD-10-CM

## 2018-08-24 DIAGNOSIS — M8949 Other hypertrophic osteoarthropathy, multiple sites: Secondary | ICD-10-CM

## 2018-08-24 MED ORDER — DICLOFENAC SODIUM 75 MG PO TBEC: 75.0000 mg | DELAYED_RELEASE_TABLET | ORAL | 3 refills | Status: AC | PRN

## 2018-08-24 NOTE — Progress Notes (Signed)
Jerome Lee 67 y.o.   Chief Complaint  Patient presents with  . Shoulder Pain    started in 03/2018 BOTH and now RIGHT shoulder flare up 08/19/2018    HISTORY OF PRESENT ILLNESS: This is a 67 y.o. male with history of osteoarthritis complaining of pain to both shoulders.  Worse the past week.  Has been getting flareups once a month for the past 6 -7 months.  No trauma.  Denies any other significant symptoms.  HPI   Prior to Admission medications   Medication Sig Start Date End Date Taking? Authorizing Provider  acetaminophen (TYLENOL) 500 MG tablet Take 500 mg by mouth every 6 (six) hours as needed.   Yes [provider]  aspirin 81 MG tablet Take 81 mg by mouth daily.   Yes [provider]  diclofenac (VOLTAREN) 75 MG EC tablet Take 75 mg by mouth as needed.   Yes [provider]    No Known Allergies  Patient Active Problem List   Diagnosis Date Noted  . Acute pain of right shoulder 03/20/2018  . Primary osteoarthritis of right shoulder 03/20/2018  . Full-thickness skin loss due to burn (third degree NOS), unspecified site   . Morbid obesity ([place])   . Weakness   . Numbness     Past Medical History:  Diagnosis Date  . Full-thickness skin loss due to burn (third degree NOS), unspecified site   . Morbid obesity ([place])   . Numbness   . Weakness     Past Surgical History:  Procedure Laterality Date  . CHOLECYSTECTOMY    . FRACTURE SURGERY    . GALLBLADDER SURGERY     2004    Social History   Socioeconomic History  . Marital status: Married    Spouse name: Not on file  . Number of children: Not on file  . Years of education: Not on file  . Highest education level: Not on file  Occupational History  . Not on file  Social Needs  . Financial resource strain: Not on file  . Food insecurity:    Worry: Not on file    Inability: Not on file  . Transportation needs:    Medical: Not on file    Non-medical: Not on file  Tobacco Use  .  Smoking status: Never Smoker  . Smokeless tobacco: Never Used  Substance and Sexual Activity  . Alcohol use: Yes  . Drug use: No  . Sexual activity: Not on file  Lifestyle  . Physical activity:    Days per week: Not on file    Minutes per session: Not on file  . Stress: Not on file  Relationships  . Social connections:    Talks on phone: Not on file    Gets together: Not on file    Attends religious service: Not on file    Active member of club or organization: Not on file    Attends meetings of clubs or organizations: Not on file    Relationship status: Not on file  . Intimate partner violence:    Fear of current or ex partner: Not on file    Emotionally abused: Not on file    Physically abused: Not on file    Forced sexual activity: Not on file  Other Topics Concern  . Not on file  Social History Narrative  . Not on file    No family history on file.   Review of Systems  Constitutional: Negative.  Negative for  chills and fever.  [name]: Negative.   Eyes: Negative.   Respiratory: Negative.  Negative for cough and shortness of breath.   Cardiovascular: Negative.  Negative for chest pain and palpitations.  Gastrointestinal: Negative.  Negative for abdominal pain, diarrhea, nausea and vomiting.  Genitourinary: Negative.   Musculoskeletal: Positive for joint pain.  Skin: Negative.  Negative for rash.  Neurological: Negative.  Negative for dizziness, sensory change, focal weakness and headaches.  Endo/Heme/Allergies: Negative.   All other systems reviewed and are negative.  Vitals:   08/24/18 1009  BP: 130/84  Pulse: 79  Resp: 16  Temp: 98.5 F (36.9 C)  SpO2: 97%     Physical Exam  Constitutional: He is oriented to person, place, and time. He appears well-developed and well-nourished.  [name]:  Head: Normocephalic and atraumatic.  Nose: Nose normal.  Mouth/Throat: Oropharynx is clear and moist.  Eyes: Pupils are equal, round, and reactive to light. EOM are  normal.  Neck: Normal range of motion.  Cardiovascular: Normal rate and regular rhythm.  Pulmonary/Chest: Effort normal and breath sounds normal.  Musculoskeletal:  Shoulders: Painful range of motion but no significant tenderness or swelling. Right upper extremities: NVI.  Neurological: He is alert and oriented to person, place, and time. No sensory deficit. He exhibits normal muscle tone.  Vitals reviewed.   A total of 25 minutes was spent in the room with the patient, greater than 50% of which was in counseling/coordination of care regarding diagnosis, treatment, medications, and need for follow-up.  ASSESSMENT & PLAN: Jerome Lee was seen today for shoulder pain.  Diagnoses and all orders for this visit:  Pain of both shoulder joints  Primary osteoarthritis involving multiple joints  Need for prophylactic vaccination against Streptococcus pneumoniae (pneumococcus) -     Pneumococcal conjugate vaccine 13-valent IM    Patient Instructions       If you have lab work done today you will be contacted with your lab results within the next 2 weeks.  If you have not heard from [place] then please contact us. The fastest way to get your results is to register for My Chart.   IF you received an x-ray today, you will receive an invoice from [facility]. Please contact [facility] at [phone] with questions or concerns regarding your invoice.   IF you received labwork today, you will receive an invoice from [place]. Please contact [facility] at [phone] with questions or concerns regarding your invoice.   Our billing staff will not be able to assist you with questions regarding bills from these companies.  You will be contacted with the lab results as soon as they are available. The fastest way to get your results is to activate your My Chart account. Instructions are located on the last page of this paperwork. If you have not heard from [place] regarding the results in 2  weeks, please contact this office.     Shoulder Pain Many things can cause shoulder pain, including:  An injury.  Moving the arm in the same way again and again (overuse).  Joint pain (arthritis).  Follow these instructions at home: Take these actions to help with your pain:  Squeeze a soft ball or a foam pad as much as you can. This helps to prevent swelling. It also makes the arm stronger.  Take over-the-counter and prescription medicines only as told by your doctor.  If told, put ice on the area: ? Put ice in a plastic bag. ? Place a towel between your  skin and the bag. ? Leave the ice on for 20 minutes, 2-3 times per day. Stop putting on ice if it does not help with the pain.  If you were given a shoulder sling or immobilizer: ? Wear it as told. ? Remove it to shower or bathe. ? Move your arm as little as possible. ? Keep your hand moving. This helps prevent swelling.  Contact a doctor if:  Your pain gets worse.  Medicine does not help your pain.  You have new pain in your arm, hand, or fingers. Get help right away if:  Your arm, hand, or fingers: ? Tingle. ? Are numb. ? Are swollen. ? Are painful. ? Turn white or blue. This information is not intended to replace advice given to you by your health care provider. Make sure you discuss any questions you have with your health care provider. Document Released: 05/17/2008 Document Revised: 07/25/2016 Document Reviewed: 03/24/2015 Elsevier Interactive Patient Education  2018 [employer].  Osteoarthritis Osteoarthritis is a type of arthritis that affects tissue that covers the ends of bones in joints (cartilage). Cartilage acts as a cushion between the bones and helps them move smoothly. Osteoarthritis results when cartilage in the joints gets worn down. Osteoarthritis is sometimes called "wear and tear" arthritis. Osteoarthritis is the most common form of arthritis. It often occurs in older people. It is a  condition that gets worse over time (a progressive condition). Joints that are most often affected by this condition are in:  Fingers.  Toes.  Hips.  Knees.  Spine, including neck and lower back.  What are the causes? This condition is caused by age-related wearing down of cartilage that covers the ends of bones. What increases the risk? The following factors may make you more likely to develop this condition:  Older age.  Being overweight or obese.  Overuse of joints, such as in athletes.  Past injury of a joint.  Past surgery on a joint.  Family history of osteoarthritis.  What are the signs or symptoms? The main symptoms of this condition are pain, swelling, and stiffness in the joint. The joint may lose its shape over time. Small pieces of bone or cartilage may break off and float inside of the joint, which may cause more pain and damage to the joint. Small deposits of bone (osteophytes) may grow on the edges of the joint. Other symptoms may include:  A grating or scraping feeling inside the joint when you move it.  Popping or creaking sounds when you move.  Symptoms may affect one or more joints. Osteoarthritis in a major joint, such as your knee or hip, can make it painful to walk or exercise. If you have osteoarthritis in your hands, you might not be able to grip items, twist your hand, or control small movements of your hands and fingers (fine motor skills). How is this diagnosed? This condition may be diagnosed based on:  Your medical history.  A physical exam.  Your symptoms.  X-rays of the affected joint(s).  Blood tests to rule out other types of arthritis.  How is this treated? There is no cure for this condition, but treatment can help to control pain and improve joint function. Treatment plans may include:  A prescribed exercise program that allows for rest and joint relief. You may work with a physical therapist.  A weight control plan.  Pain  relief techniques, such as: ? Applying heat and cold to the joint. ? Electric pulses delivered to  nerve endings under the skin (transcutaneous electrical nerve stimulation, or TENS). ? Massage. ? Certain nutritional supplements.  NSAIDs or prescription medicines to help relieve pain.  Medicine to help relieve pain and inflammation (corticosteroids). This can be given by mouth (orally) or as an injection.  Assistive devices, such as a brace, wrap, splint, specialized glove, or cane.  Surgery, such as: ? An osteotomy. This is done to reposition the bones and relieve pain or to remove loose pieces of bone and cartilage. ? Joint replacement surgery. You may need this surgery if you have very bad (advanced) osteoarthritis.  Follow these instructions at home: Activity  Rest your affected joints as directed by your health care provider.  Do not drive or use heavy machinery while taking prescription pain medicine.  Exercise as directed. Your health care provider or physical therapist may recommend specific types of exercise, such as: ? Strengthening exercises. These are done to strengthen the muscles that support joints that are affected by arthritis. They can be performed with weights or with exercise bands to add resistance. ? Aerobic activities. These are exercises, such as brisk walking or water aerobics, that get your heart pumping. ? Range-of-motion activities. These keep your joints easy to move. ? Balance and agility exercises. Managing pain, stiffness, and swelling  If directed, apply heat to the affected area as often as told by your health care provider. Use the heat source that your health care provider recommends, such as a moist heat pack or a heating pad. ? If you have a removable assistive device, remove it as told by your health care provider. ? Place a towel between your skin and the heat source. If your health care provider tells you to keep the assistive device on while  you apply heat, place a towel between the assistive device and the heat source. ? Leave the heat on for 20-30 minutes. ? Remove the heat if your skin turns bright red. This is especially important if you are unable to feel pain, heat, or cold. You may have a greater risk of getting burned.  If directed, put ice on the affected joint: ? If you have a removable assistive device, remove it as told by your health care provider. ? Put ice in a plastic bag. ? Place a towel between your skin and the bag. If your health care provider tells you to keep the assistive device on during icing, place a towel between the assistive device and the bag. ? Leave the ice on for 20 minutes, 2-3 times a day. General instructions  Take over-the-counter and prescription medicines only as told by your health care provider.  Maintain a healthy weight. Follow instructions from your health care provider for weight control. These may include dietary restrictions.  Do not use any products that contain nicotine or tobacco, such as cigarettes and e-cigarettes. These can delay bone healing. If you need help quitting, ask your health care provider.  Use assistive devices as directed by your health care provider.  Keep all follow-up visits as told by your health care provider. This is important. Where to find more information:  [employer] of Arthritis and Musculoskeletal and Skin Diseases: www.niams.SouthExposed.es  [employer] on Aging: http://kim-miller.com/  American College of Rheumatology: www.rheumatology.org Contact a health care provider if:  Your skin turns red.  You develop a rash.  You have pain that gets worse.  You have a fever along with joint or muscle aches. Get help right away if:  You lose  a lot of weight.  You suddenly lose your appetite.  You have night sweats. Summary  Osteoarthritis is a type of arthritis that affects tissue covering the ends of bones in joints (cartilage).  This  condition is caused by age-related wearing down of cartilage that covers the ends of bones.  The main symptom of this condition is pain, swelling, and stiffness in the joint.  There is no cure for this condition, but treatment can help to control pain and improve joint function. This information is not intended to replace advice given to you by your health care provider. Make sure you discuss any questions you have with your health care provider. Document Released: 11/29/2005 Document Revised: 08/02/2016 Document Reviewed: 08/02/2016 Elsevier Interactive Patient Education  2018 Elsevier Inc.      Agustina Caroli, MD [facility]

## 2018-08-24 NOTE — Patient Instructions (Addendum)
If you have lab work done today you will be contacted with your lab results within the next 2 weeks.  If you have not heard from [place] then please contact us. The fastest way to get your results is to register for My Chart.   IF you received an x-ray today, you will receive an invoice from [facility]. Please contact [facility] at [phone] with questions or concerns regarding your invoice.   IF you received labwork today, you will receive an invoice from [place]. Please contact [facility] at [phone] with questions or concerns regarding your invoice.   Our billing staff will not be able to assist you with questions regarding bills from these companies.  You will be contacted with the lab results as soon as they are available. The fastest way to get your results is to activate your My Chart account. Instructions are located on the last page of this paperwork. If you have not heard from [place] regarding the results in 2 weeks, please contact this office.     Shoulder Pain Many things can cause shoulder pain, including:  An injury.  Moving the arm in the same way again and again (overuse).  Joint pain (arthritis).  Follow these instructions at home: Take these actions to help with your pain:  Squeeze a soft ball or a foam pad as much as you can. This helps to prevent swelling. It also makes the arm stronger.  Take over-the-counter and prescription medicines only as told by your doctor.  If told, put ice on the area: ? Put ice in a plastic bag. ? Place a towel between your skin and the bag. ? Leave the ice on for 20 minutes, 2-3 times per day. Stop putting on ice if it does not help with the pain.  If you were given a shoulder sling or immobilizer: ? Wear it as told. ? Remove it to shower or bathe. ? Move your arm as little as possible. ? Keep your hand moving. This helps prevent swelling.  Contact a doctor if:  Your pain gets worse.  Medicine does  not help your pain.  You have new pain in your arm, hand, or fingers. Get help right away if:  Your arm, hand, or fingers: ? Tingle. ? Are numb. ? Are swollen. ? Are painful. ? Turn white or blue. This information is not intended to replace advice given to you by your health care provider. Make sure you discuss any questions you have with your health care provider. Document Released: 05/17/2008 Document Revised: 07/25/2016 Document Reviewed: 03/24/2015 Elsevier Interactive Patient Education  2018 [employer].  Osteoarthritis Osteoarthritis is a type of arthritis that affects tissue that covers the ends of bones in joints (cartilage). Cartilage acts as a cushion between the bones and helps them move smoothly. Osteoarthritis results when cartilage in the joints gets worn down. Osteoarthritis is sometimes called "wear and tear" arthritis. Osteoarthritis is the most common form of arthritis. It often occurs in older people. It is a condition that gets worse over time (a progressive condition). Joints that are most often affected by this condition are in:  Fingers.  Toes.  Hips.  Knees.  Spine, including neck and lower back.  What are the causes? This condition is caused by age-related wearing down of cartilage that covers the ends of bones. What increases the risk? The following factors may make you more likely to develop this condition:  Older age.  Being overweight or obese.  Overuse of joints, such as in athletes.  Past injury of a joint.  Past surgery on a joint.  Family history of osteoarthritis.  What are the signs or symptoms? The main symptoms of this condition are pain, swelling, and stiffness in the joint. The joint may lose its shape over time. Small pieces of bone or cartilage may break off and float inside of the joint, which may cause more pain and damage to the joint. Small deposits of bone (osteophytes) may grow on the edges of the joint. Other symptoms  may include:  A grating or scraping feeling inside the joint when you move it.  Popping or creaking sounds when you move.  Symptoms may affect one or more joints. Osteoarthritis in a major joint, such as your knee or hip, can make it painful to walk or exercise. If you have osteoarthritis in your hands, you might not be able to grip items, twist your hand, or control small movements of your hands and fingers (fine motor skills). How is this diagnosed? This condition may be diagnosed based on:  Your medical history.  A physical exam.  Your symptoms.  X-rays of the affected joint(s).  Blood tests to rule out other types of arthritis.  How is this treated? There is no cure for this condition, but treatment can help to control pain and improve joint function. Treatment plans may include:  A prescribed exercise program that allows for rest and joint relief. You may work with a physical therapist.  A weight control plan.  Pain relief techniques, such as: ? Applying heat and cold to the joint. ? Electric pulses delivered to nerve endings under the skin (transcutaneous electrical nerve stimulation, or TENS). ? Massage. ? Certain nutritional supplements.  NSAIDs or prescription medicines to help relieve pain.  Medicine to help relieve pain and inflammation (corticosteroids). This can be given by mouth (orally) or as an injection.  Assistive devices, such as a brace, wrap, splint, specialized glove, or cane.  Surgery, such as: ? An osteotomy. This is done to reposition the bones and relieve pain or to remove loose pieces of bone and cartilage. ? Joint replacement surgery. You may need this surgery if you have very bad (advanced) osteoarthritis.  Follow these instructions at home: Activity  Rest your affected joints as directed by your health care provider.  Do not drive or use heavy machinery while taking prescription pain medicine.  Exercise as directed. Your health care  provider or physical therapist may recommend specific types of exercise, such as: ? Strengthening exercises. These are done to strengthen the muscles that support joints that are affected by arthritis. They can be performed with weights or with exercise bands to add resistance. ? Aerobic activities. These are exercises, such as brisk walking or water aerobics, that get your heart pumping. ? Range-of-motion activities. These keep your joints easy to move. ? Balance and agility exercises. Managing pain, stiffness, and swelling  If directed, apply heat to the affected area as often as told by your health care provider. Use the heat source that your health care provider recommends, such as a moist heat pack or a heating pad. ? If you have a removable assistive device, remove it as told by your health care provider. ? Place a towel between your skin and the heat source. If your health care provider tells you to keep the assistive device on while you apply heat, place a towel between the assistive device and the heat source. ? Leave  the heat on for 20-30 minutes. ? Remove the heat if your skin turns bright red. This is especially important if you are unable to feel pain, heat, or cold. You may have a greater risk of getting burned.  If directed, put ice on the affected joint: ? If you have a removable assistive device, remove it as told by your health care provider. ? Put ice in a plastic bag. ? Place a towel between your skin and the bag. If your health care provider tells you to keep the assistive device on during icing, place a towel between the assistive device and the bag. ? Leave the ice on for 20 minutes, 2-3 times a day. General instructions  Take over-the-counter and prescription medicines only as told by your health care provider.  Maintain a healthy weight. Follow instructions from your health care provider for weight control. These may include dietary restrictions.  Do not use any  products that contain nicotine or tobacco, such as cigarettes and e-cigarettes. These can delay bone healing. If you need help quitting, ask your health care provider.  Use assistive devices as directed by your health care provider.  Keep all follow-up visits as told by your health care provider. This is important. Where to find more information:  [employer] of Arthritis and Musculoskeletal and Skin Diseases: www.niams.SouthExposed.es  [employer] on Aging: http://kim-miller.com/  American College of Rheumatology: www.rheumatology.org Contact a health care provider if:  Your skin turns red.  You develop a rash.  You have pain that gets worse.  You have a fever along with joint or muscle aches. Get help right away if:  You lose a lot of weight.  You suddenly lose your appetite.  You have night sweats. Summary  Osteoarthritis is a type of arthritis that affects tissue covering the ends of bones in joints (cartilage).  This condition is caused by age-related wearing down of cartilage that covers the ends of bones.  The main symptom of this condition is pain, swelling, and stiffness in the joint.  There is no cure for this condition, but treatment can help to control pain and improve joint function. This information is not intended to replace advice given to you by your health care provider. Make sure you discuss any questions you have with your health care provider. Document Released: 11/29/2005 Document Revised: 08/02/2016 Document Reviewed: 08/02/2016 Elsevier Interactive Patient Education  [employer].

## 2018-09-04 ENCOUNTER — Encounter: Payer: Self-pay | Admitting: Emergency Medicine

## 2018-09-05 ENCOUNTER — Other Ambulatory Visit: Payer: Self-pay | Admitting: Emergency Medicine

## 2018-09-05 MED ORDER — DICLOFENAC SODIUM 75 MG PO TBEC: 75.0000 mg | DELAYED_RELEASE_TABLET | Freq: Two times a day (BID) | ORAL | 0 refills | Status: AC

## 2018-09-05 NOTE — Telephone Encounter (Signed)
Medication sent.  Thanks.

## 2018-09-13 ENCOUNTER — Telehealth: Payer: Self-pay | Admitting: *Deleted

## 2018-09-13 NOTE — Telephone Encounter (Signed)
Faxed prescription to [facility] for diclofenac 75 mg EC tablet with sig take by mouth for 5 days and then as needed, per Dr Mitchel Honour. Confirmation page received at 5:54 pm.

## 2018-09-25 ENCOUNTER — Telehealth: Payer: Self-pay | Admitting: *Deleted

## 2018-09-25 NOTE — Telephone Encounter (Signed)
Faxed prescription clarification for diclofenac 75 mg to [place]. confirmation page received at 1:32 pm.

## 2019-01-17 ENCOUNTER — Other Ambulatory Visit: Payer: Self-pay

## 2019-01-17 ENCOUNTER — Encounter: Payer: Self-pay | Admitting: Emergency Medicine

## 2019-01-17 ENCOUNTER — Ambulatory Visit: Payer: BC Managed Care – PPO | Admitting: Emergency Medicine

## 2019-01-17 VITALS — BP 122/72 | HR 107 | Temp 98.0°F | Resp 16 | Ht 64.0 in | Wt 232.0 lb

## 2019-01-17 DIAGNOSIS — J988 Other specified respiratory disorders: Secondary | ICD-10-CM

## 2019-01-17 DIAGNOSIS — B9789 Other viral agents as the cause of diseases classified elsewhere: Secondary | ICD-10-CM | POA: Diagnosis not present

## 2019-01-17 DIAGNOSIS — R05 Cough: Secondary | ICD-10-CM

## 2019-01-17 DIAGNOSIS — R059 Cough, unspecified: Secondary | ICD-10-CM

## 2019-01-17 MED ORDER — BENZONATATE 200 MG PO CAPS: 200.0000 mg | ORAL_CAPSULE | Freq: Two times a day (BID) | ORAL | 0 refills | Status: DC | PRN

## 2019-01-17 MED ORDER — PROMETHAZINE-CODEINE 6.25-10 MG/5ML PO SYRP
5.0000 mL | ORAL_SOLUTION | Freq: Every evening | ORAL | 0 refills | Status: DC | PRN
Start: 2019-01-17 — End: 2020-12-15
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
## 2019-01-17 NOTE — Patient Instructions (Addendum)
     If you have lab work done today you will be contacted with your lab results within the next 2 weeks.  If you have not heard from us then please contact us. The fastest way to get your results is to register for My Chart.   IF you received an x-ray today, you will receive an invoice from [facility]. Please contact [facility] at [phone] with questions or concerns regarding your invoice.   IF you received labwork today, you will receive an invoice from [facility]. Please contact [facility] at [phone] with questions or concerns regarding your invoice.   Our billing staff will not be able to assist you with questions regarding bills from these companies.  You will be contacted with the lab results as soon as they are available. The fastest way to get your results is to activate your My Chart account. Instructions are located on the last page of this paperwork. If you have not heard from us regarding the results in 2 weeks, please contact this office.     Cough, Adult  A cough helps to clear your throat and lungs. A cough may last only 2-3 weeks (acute), or it may last longer than 8 weeks (chronic). Many different things can cause a cough. A cough may be a sign of an illness or another medical condition. Follow these instructions at home:  Pay attention to any changes in your cough.  Take medicines only as told by your doctor. ? If you were prescribed an antibiotic medicine, take it as told by your doctor. Do not stop taking it even if you start to feel better. ? Talk with your doctor before you try using a cough medicine.  Drink enough fluid to keep your pee (urine) clear or pale yellow.  If the air is dry, use a cold steam vaporizer or humidifier in your home.  Stay away from things that make you cough at work or at home.  If your cough is worse at night, try using extra pillows to raise your head up higher while you sleep.  Do not smoke, and try not to  be around smoke. If you need help quitting, ask your doctor.  Do not have caffeine.  Do not drink alcohol.  Rest as needed. Contact a doctor if:  You have new problems (symptoms).  You cough up yellow fluid (pus).  Your cough does not get better after 2-3 weeks, or your cough gets worse.  Medicine does not help your cough and you are not sleeping well.  You have pain that gets worse or pain that is not helped with medicine.  You have a fever.  You are losing weight and you do not know why.  You have night sweats. Get help right away if:  You cough up blood.  You have trouble breathing.  Your heartbeat is very fast. This information is not intended to replace advice given to you by your health care provider. Make sure you discuss any questions you have with your health care provider. Document Released: 08/12/2011 Document Revised: 05/06/2016 Document Reviewed: 02/05/2015 Elsevier Interactive Patient Education  2019 Elsevier Inc.  

## 2019-09-26 ENCOUNTER — Ambulatory Visit (INDEPENDENT_AMBULATORY_CARE_PROVIDER_SITE_OTHER): Payer: Medicare Other | Admitting: Emergency Medicine

## 2019-09-26 ENCOUNTER — Other Ambulatory Visit: Payer: Self-pay

## 2019-09-26 DIAGNOSIS — Z23 Encounter for immunization: Secondary | ICD-10-CM

## 2019-09-26 NOTE — Progress Notes (Signed)
Pt came under  nursing schedule and administered regular flu vaccine

## 2019-10-19 DIAGNOSIS — Z23 Encounter for immunization: Secondary | ICD-10-CM | POA: Diagnosis not present

## 2019-10-19 NOTE — Addendum Note (Signed)
Addended by: Elta Guadeloupe on: 10/19/2019 01:50 PM   Modules accepted: Orders

## 2020-01-09 ENCOUNTER — Encounter: Payer: Self-pay | Admitting: Registered Nurse

## 2020-01-09 ENCOUNTER — Other Ambulatory Visit: Payer: Self-pay | Admitting: Registered Nurse

## 2020-01-09 ENCOUNTER — Ambulatory Visit (INDEPENDENT_AMBULATORY_CARE_PROVIDER_SITE_OTHER): Payer: BC Managed Care – PPO | Admitting: Registered Nurse

## 2020-01-09 ENCOUNTER — Other Ambulatory Visit: Payer: Self-pay

## 2020-01-09 VITALS — BP 125/77 | HR 124 | Temp 97.3°F | Ht 64.0 in | Wt 243.6 lb

## 2020-01-09 DIAGNOSIS — L270 Generalized skin eruption due to drugs and medicaments taken internally: Secondary | ICD-10-CM | POA: Diagnosis not present

## 2020-01-09 DIAGNOSIS — M19011 Primary osteoarthritis, right shoulder: Secondary | ICD-10-CM

## 2020-01-09 MED ORDER — HYDROXYZINE HCL 25 MG PO TABS: 25.0000 mg | ORAL_TABLET | Freq: Three times a day (TID) | ORAL | 0 refills | Status: DC | PRN

## 2020-01-09 MED ORDER — DICLOFENAC SODIUM 1 % EX GEL: 2.0000 g | Freq: Four times a day (QID) | CUTANEOUS | 0 refills | Status: DC

## 2020-01-09 MED ORDER — PREDNISONE 10 MG PO TABS: ORAL_TABLET | ORAL | 0 refills | Status: AC
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
## 2020-01-09 NOTE — Patient Instructions (Signed)
° ° ° °  If you have lab work done today you will be contacted with your lab results within the next 2 weeks.  If you have not heard from us then please contact us. The fastest way to get your results is to register for My Chart. ° ° °IF you received an x-ray today, you will receive an invoice from [facility]. Please contact [facility] at [phone] with questions or concerns regarding your invoice.  ° °IF you received labwork today, you will receive an invoice from [facility]. Please contact [facility] at [phone] with questions or concerns regarding your invoice.  ° °Our billing staff will not be able to assist you with questions regarding bills from these companies. ° °You will be contacted with the lab results as soon as they are available. The fastest way to get your results is to activate your My Chart account. Instructions are located on the last page of this paperwork. If you have not heard from us regarding the results in 2 weeks, please contact this office. °  ° ° ° °

## 2020-01-11 ENCOUNTER — Telehealth: Payer: Self-pay | Admitting: Emergency Medicine

## 2020-01-11 NOTE — Telephone Encounter (Signed)
Pa request processed for diclofenac sodium. Key [id number]

## 2020-01-11 NOTE — Telephone Encounter (Signed)
PA request approved for 01/11/20-01/10/23

## 2020-01-16 ENCOUNTER — Telehealth: Payer: Self-pay | Admitting: Emergency Medicine

## 2020-01-16 ENCOUNTER — Other Ambulatory Visit: Payer: Self-pay | Admitting: Registered Nurse

## 2020-01-16 ENCOUNTER — Ambulatory Visit: Payer: BC Managed Care – PPO

## 2020-01-16 ENCOUNTER — Other Ambulatory Visit: Payer: Self-pay

## 2020-01-16 DIAGNOSIS — R21 Rash and other nonspecific skin eruption: Secondary | ICD-10-CM

## 2020-01-16 MED ORDER — BETAMETHASONE VALERATE 0.1 % EX OINT: 1.0000 "application " | TOPICAL_OINTMENT | Freq: Two times a day (BID) | CUTANEOUS | 0 refills | Status: DC

## 2020-01-16 NOTE — Telephone Encounter (Signed)
I will send over steroid cream for him to use on the areas that are most affected. He should not use this on his face or groin.  Thank you  Kathrin Ruddy, NP

## 2020-01-16 NOTE — Telephone Encounter (Signed)
Patient came into the office today for his flu shot but he had already received the flu shot. But while he was here in office he stated he was given hydroxyzine but he is still currently itching. What else do patient need to do for the itching

## 2020-01-16 NOTE — Telephone Encounter (Signed)
Detail msg was left on patient machine that a steroid cream has been sent to his pharm. An Orland Mustard instruction below.If patient have any further question give our office a call

## 2020-10-02 ENCOUNTER — Ambulatory Visit (INDEPENDENT_AMBULATORY_CARE_PROVIDER_SITE_OTHER): Payer: Medicare Other | Admitting: Family Medicine

## 2020-10-02 ENCOUNTER — Other Ambulatory Visit: Payer: Self-pay

## 2020-10-02 ENCOUNTER — Ambulatory Visit: Payer: BC Managed Care – PPO | Admitting: Emergency Medicine

## 2020-10-02 DIAGNOSIS — Z23 Encounter for immunization: Secondary | ICD-10-CM | POA: Diagnosis not present

## 2020-10-03 ENCOUNTER — Ambulatory Visit: Payer: Medicare Other | Attending: Internal Medicine

## 2020-10-03 DIAGNOSIS — Z23 Encounter for immunization: Secondary | ICD-10-CM

## 2020-10-03 NOTE — Progress Notes (Signed)
   Covid-19 Vaccination Clinic  Name:  Jerome Lee    MRN: [id number] DOB: 1951/01/08  10/03/2020  [name] was observed post Covid-19 immunization for 30 minutes based on pre-vaccination screening without incident. He was provided with Vaccine Information Sheet and instruction to access the V-Safe system.   [name] was instructed to call 911 with any severe reactions post vaccine: Marland Kitchen Difficulty breathing  . Swelling of face and throat  . A fast heartbeat  . A bad rash all over body  . Dizziness and weakness

## 2020-12-15 ENCOUNTER — Other Ambulatory Visit: Payer: Self-pay

## 2020-12-15 ENCOUNTER — Encounter: Payer: Self-pay | Admitting: Family Medicine

## 2020-12-15 ENCOUNTER — Ambulatory Visit (INDEPENDENT_AMBULATORY_CARE_PROVIDER_SITE_OTHER): Payer: Medicare Other | Admitting: Family Medicine

## 2020-12-15 VITALS — BP 162/100 | HR 91 | Temp 97.9°F | Ht 64.0 in | Wt 232.0 lb

## 2020-12-15 DIAGNOSIS — Z13220 Encounter for screening for lipoid disorders: Secondary | ICD-10-CM

## 2020-12-15 DIAGNOSIS — M7989 Other specified soft tissue disorders: Secondary | ICD-10-CM

## 2020-12-15 DIAGNOSIS — Z131 Encounter for screening for diabetes mellitus: Secondary | ICD-10-CM

## 2020-12-15 DIAGNOSIS — E559 Vitamin D deficiency, unspecified: Secondary | ICD-10-CM | POA: Diagnosis not present

## 2020-12-15 DIAGNOSIS — R7989 Other specified abnormal findings of blood chemistry: Secondary | ICD-10-CM

## 2020-12-15 DIAGNOSIS — R7309 Other abnormal glucose: Secondary | ICD-10-CM

## 2020-12-15 DIAGNOSIS — Z1329 Encounter for screening for other suspected endocrine disorder: Secondary | ICD-10-CM

## 2020-12-15 DIAGNOSIS — I10 Essential (primary) hypertension: Secondary | ICD-10-CM

## 2020-12-15 MED ORDER — VALSARTAN 80 MG PO TABS: 80.0000 mg | ORAL_TABLET | Freq: Every day | ORAL | 3 refills | Status: DC

## 2020-12-15 MED ORDER — HYDROCHLOROTHIAZIDE 12.5 MG PO TABS: 12.5000 mg | ORAL_TABLET | Freq: Every day | ORAL | 3 refills | Status: DC

## 2020-12-15 NOTE — Patient Instructions (Addendum)
Hypertension, Adult Hypertension is another name for high blood pressure. High blood pressure forces your heart to work harder to pump blood. This can cause problems over time. There are two numbers in a blood pressure reading. There is a top number (systolic) over a bottom number (diastolic). It is best to have a blood pressure that is below 120/80. Healthy choices can help lower your blood pressure, or you may need medicine to help lower it. What are the causes? The cause of this condition is not known. Some conditions may be related to high blood pressure. What increases the risk?  Smoking.  Having type 2 diabetes mellitus, high cholesterol, or both.  Not getting enough exercise or physical activity.  Being overweight.  Having too much fat, sugar, calories, or salt (sodium) in your diet.  Drinking too much alcohol.  Having long-term (chronic) kidney disease.  Having a family history of high blood pressure.  Age. Risk increases with age.  Race. You may be at higher risk if you are African American.  Gender. Men are at higher risk than women before age 66. After age 7, women are at higher risk than men.  Having obstructive sleep apnea.  Stress. What are the signs or symptoms?  High blood pressure may not cause symptoms. Very high blood pressure (hypertensive crisis) may cause: ? Headache. ? Feelings of worry or nervousness (anxiety). ? Shortness of breath. ? Nosebleed. ? A feeling of being sick to your stomach (nausea). ? Throwing up (vomiting). ? Changes in how you see. ? Very bad chest pain. ? Seizures. How is this treated?  This condition is treated by making healthy lifestyle changes, such as: ? Eating healthy foods. ? Exercising more. ? Drinking less alcohol.  Your health care provider may prescribe medicine if lifestyle changes are not enough to get your blood pressure under control, and if: ? Your top number is above 130. ? Your bottom number is above  80.  Your personal target blood pressure may vary. Follow these instructions at home: Eating and drinking   If told, follow the DASH eating plan. To follow this plan: ? Fill one half of your plate at each meal with fruits and vegetables. ? Fill one fourth of your plate at each meal with whole grains. Whole grains include whole-wheat pasta, brown rice, and whole-grain bread. ? Eat or drink low-fat dairy products, such as skim milk or low-fat yogurt. ? Fill one fourth of your plate at each meal with low-fat (lean) proteins. Low-fat proteins include fish, chicken without skin, eggs, beans, and tofu. ? Avoid fatty meat, cured and processed meat, or chicken with skin. ? Avoid pre-made or processed food.  Eat less than 1,500 mg of salt each day.  Do not drink alcohol if: ? Your doctor tells you not to drink. ? You are pregnant, may be pregnant, or are planning to become pregnant.  If you drink alcohol: ? Limit how much you use to:  0-1 drink a day for women.  0-2 drinks a day for men. ? Be aware of how much alcohol is in your drink. In the [place] one drink equals one 12 oz bottle of beer (355 mL), one 5 oz glass of wine (148 mL), or one 1 oz glass of hard liquor (44 mL). Lifestyle   Work with your doctor to stay at a healthy weight or to lose weight. Ask your doctor what the best weight is for you.  Get at least 30 minutes of exercise  days of the week. This may include walking, swimming, or biking.  Get at least 30 minutes of exercise that strengthens your muscles (resistance exercise) at least 3 days a week. This may include lifting weights or doing Pilates.  Do not use any products that contain nicotine or tobacco, such as cigarettes, e-cigarettes, and chewing tobacco. If you need help quitting, ask your doctor.  Check your blood pressure at home as told by your doctor.  Keep all follow-up visits as told by your doctor. This is important. Medicines  Take over-the-counter and  prescription medicines only as told by your doctor. Follow directions carefully.  Do not skip doses of blood pressure medicine. The medicine does not work as well if you skip doses. Skipping doses also puts you at risk for problems.  Ask your doctor about side effects or reactions to medicines that you should watch for. Contact a doctor if you:  Think you are having a reaction to the medicine you are taking.  Have headaches that keep coming back (recurring).  Feel dizzy.  Have swelling in your ankles.  Have trouble with your vision. Get help right away if you:  Get a very bad headache.  Start to feel mixed up (confused).  Feel weak or numb.  Feel faint.  Have very bad pain in your: ? Chest. ? Belly (abdomen).  Throw up more than once.  Have trouble breathing. Summary  Hypertension is another name for high blood pressure.  High blood pressure forces your heart to work harder to pump blood.  For most people, a normal blood pressure is less than 120/80.  Making healthy choices can help lower blood pressure. If your blood pressure does not get lower with healthy choices, you may need to take medicine. This information is not intended to replace advice given to you by your health care provider. Make sure you discuss any questions you have with your health care provider. Document Revised: 08/09/2018 Document Reviewed: 08/09/2018 Elsevier Patient Education  2020 [employer]     If you have lab work done today you will be contacted with your lab results within the next 2 weeks.  If you have not heard from us then please contact us. The fastest way to get your results is to register for My Chart.   IF you received an x-ray today, you will receive an invoice from [facility]. Please contact [facility] at [phone] with questions or concerns regarding your invoice.   IF you received labwork today, you will receive an invoice from [facility]. Please  contact [facility] at [phone] with questions or concerns regarding your invoice.   Our billing staff will not be able to assist you with questions regarding bills from these companies.  You will be contacted with the lab results as soon as they are available. The fastest way to get your results is to activate your My Chart account. Instructions are located on the last page of this paperwork. If you have not heard from us regarding the results in 2 weeks, please contact this office.      
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
## 2020-12-16 ENCOUNTER — Other Ambulatory Visit: Payer: Self-pay | Admitting: Family Medicine

## 2020-12-16 DIAGNOSIS — E7849 Other hyperlipidemia: Secondary | ICD-10-CM

## 2020-12-16 DIAGNOSIS — Z832 Family history of diseases of the blood and blood-forming organs and certain disorders involving the immune mechanism: Secondary | ICD-10-CM

## 2020-12-16 LAB — CMP14+CBC/D/PLT+FER+RETIC+V...
ALT: 24 IU/L (ref 0–44)
AST: 25 IU/L (ref 0–40)
Albumin/Globulin Ratio: 1.7 (ref 1.2–2.2)
Albumin: 4.7 g/dL (ref 3.8–4.8)
Alkaline Phosphatase: 68 IU/L (ref 44–121)
BUN/Creatinine Ratio: 13 (ref 10–24)
BUN: 15 mg/dL (ref 8–27)
Basophils Absolute: 0 10*3/uL (ref 0.0–0.2)
Basos: 0 %
Bilirubin Total: 0.9 mg/dL (ref 0.0–1.2)
CO2: 22 mmol/L (ref 20–29)
Calcium: 9.8 mg/dL (ref 8.6–10.2)
Chloride: 101 mmol/L (ref 96–106)
Creatinine, Ser: 1.15 mg/dL (ref 0.76–1.27)
EOS (ABSOLUTE): 0.2 10*3/uL (ref 0.0–0.4)
Eos: 2 %
Ferritin: 96 ng/mL (ref 30–400)
GFR calc Af Amer: 75 mL/min/{1.73_m2} (ref >59)
GFR calc non Af Amer: 65 mL/min/{1.73_m2} (ref >59)
Globulin, Total: 2.7 g/dL (ref 1.5–4.5)
Glucose: 74 mg/dL (ref 65–99)
Hematocrit: 45.4 % (ref 37.5–51.0)
Hemoglobin: 15.2 g/dL (ref 13.0–17.7)
Immature Grans (Abs): 0.1 10*3/uL (ref 0.0–0.1)
Immature Granulocytes: 1 %
Lymphocytes Absolute: 2 10*3/uL (ref 0.7–3.1)
Lymphs: 21 %
MCH: 29.3 pg (ref 26.6–33.0)
MCHC: 33.5 g/dL (ref 31.5–35.7)
MCV: 88 fL (ref 79–97)
Monocytes Absolute: 0.8 10*3/uL (ref 0.1–0.9)
Monocytes: 9 %
Neutrophils Absolute: 6.5 10*3/uL (ref 1.4–7.0)
Neutrophils: 67 %
Platelets: 279 10*3/uL (ref 150–450)
Potassium: 4.1 mmol/L (ref 3.5–5.2)
RBC: 5.18 x10E6/uL (ref 4.14–5.80)
RDW: 14.2 % (ref 11.6–15.4)
Retic Ct Pct: 2.4 % (ref 0.6–2.6)
Sodium: 138 mmol/L (ref 134–144)
Total Protein: 7.4 g/dL (ref 6.0–8.5)
Vit D, 25-Hydroxy: 34.9 ng/mL (ref 30.0–100.0)
WBC: 9.6 10*3/uL (ref 3.4–10.8)

## 2020-12-16 LAB — LIPID PANEL
Chol/HDL Ratio: 4.6 ratio (ref 0.0–5.0)
Cholesterol, Total: 239 mg/dL — ABNORMAL HIGH (ref 100–199)
HDL: 52 mg/dL (ref >39)
LDL Chol Calc (NIH): 166 mg/dL — ABNORMAL HIGH (ref 0–99)
Triglycerides: 115 mg/dL (ref 0–149)
VLDL Cholesterol Cal: 21 mg/dL (ref 5–40)

## 2020-12-16 LAB — LYME AB/WESTERN BLOT REFLEX
LYME DISEASE AB, QUANT, IGM: <0.8 index (ref 0.00–0.79)
Lyme IgG/IgM Ab: <0.91 {ISR} (ref 0.00–0.90)

## 2020-12-16 LAB — HEMOGLOBIN A1C
Est. average glucose Bld gHb Est-mCnc: 88 mg/dL
Hgb A1c MFr Bld: 4.7 % — ABNORMAL LOW (ref 4.8–5.6)

## 2020-12-16 LAB — TSH: TSH: 2.43 u[IU]/mL (ref 0.450–4.500)

## 2020-12-16 MED ORDER — ROSUVASTATIN CALCIUM 10 MG PO TABS: 10.0000 mg | ORAL_TABLET | Freq: Every day | ORAL | 3 refills | Status: DC

## 2020-12-16 NOTE — Progress Notes (Signed)
Is there anyway to get a sickle cell screen added on to his labs? I assumed the panel would have that. Thanks!
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
## 2020-12-17 NOTE — Addendum Note (Signed)
Addended by: Tobie Poet on: 12/17/2020 03:43 PM   Modules accepted: Orders

## 2020-12-19 LAB — HGB FRACTIONATION CASCADE
Hgb A2: 3.4 % — ABNORMAL HIGH (ref 1.8–3.2)
Hgb A: 60.5 % — ABNORMAL LOW (ref 96.4–98.8)
Hgb F: 0 % (ref 0.0–2.0)
Hgb S: 36.1 % — ABNORMAL HIGH

## 2020-12-19 LAB — HGB SOLUBILITY: Hgb Solubility: POSITIVE — AB

## 2020-12-19 LAB — SPECIMEN STATUS REPORT

## 2020-12-24 ENCOUNTER — Encounter: Payer: Self-pay | Admitting: Emergency Medicine

## 2020-12-24 ENCOUNTER — Ambulatory Visit (INDEPENDENT_AMBULATORY_CARE_PROVIDER_SITE_OTHER): Payer: Medicare Other | Admitting: Emergency Medicine

## 2020-12-24 ENCOUNTER — Other Ambulatory Visit: Payer: Self-pay

## 2020-12-24 VITALS — BP 129/74 | HR 84 | Temp 97.8°F | Resp 16 | Ht 65.0 in | Wt 229.0 lb

## 2020-12-24 DIAGNOSIS — I10 Essential (primary) hypertension: Secondary | ICD-10-CM | POA: Diagnosis not present

## 2020-12-24 DIAGNOSIS — Z832 Family history of diseases of the blood and blood-forming organs and certain disorders involving the immune mechanism: Secondary | ICD-10-CM

## 2020-12-24 DIAGNOSIS — D573 Sickle-cell trait: Secondary | ICD-10-CM | POA: Diagnosis not present
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
## 2020-12-24 NOTE — Patient Instructions (Addendum)
If you have lab work done today you will be contacted with your lab results within the next 2 weeks.  If you have not heard from [place] then please contact us. The fastest way to get your results is to register for My Chart.   IF you received an x-ray today, you will receive an invoice from [facility]. Please contact [facility] at [phone] with questions or concerns regarding your invoice.   IF you received labwork today, you will receive an invoice from [facility]. Please contact [facility] at [phone] with questions or concerns regarding your invoice.   Our billing staff will not be able to assist you with questions regarding bills from these companies.  You will be contacted with the lab results as soon as they are available. The fastest way to get your results is to activate your My Chart account. Instructions are located on the last page of this paperwork. If you have not heard from [place] regarding the results in 2 weeks, please contact this office.    What You Should Know About Sickle Cell Trait What is sickle cell trait? Sickle cell trait (SCT) is not a mild form of sickle cell disease. Having SCT simply means that a person carries a single gene for sickle cell disease (SCD) and can pass this gene along to their children. People with SCT usually do not have any of the symptoms of SCD and live a normal life. Hemoglobin is found in red blood cells and it gives blood its color. It carries oxygen to all parts of the body. Hemoglobin is made from two similar proteins, one called alpha-globin and one called beta-globin, that "stick together." Both proteins must be present and function normally for the hemoglobin to carry out its job in the body. People with SCT have red blood cells that have normal hemoglobin and abnormal hemoglobin. Genes are the instructions that control how red blood cells make alpha- and beta-globin proteins. All people have two genes for making  beta-globin. They get one beta-globin gene from each parent. SCT occurs when a person inherits a gene for sickle beta-globin from one parent and a gene for normal beta-globin from the other parent. This means the person won't have sickle cell disease, but will be a trait "carrier" and can pass it on to their children. What is sickle cell disease? SCD is a genetic condition that is present at birth. In SCD, the red blood cells become hard and sticky and look like a C-shaped farm tool called a "sickle." The sickle cells die early, which causes a constant shortage of red blood cells. Also, when they travel through small blood vessels, they get stuck and clog the blood flow. This can cause pain and other serious problems. It is inherited when a child receives two sickle beta-globin genes--one from each parent. Therefore, a child can only have SCD when both of his/her parents have at least one abnormal beta-globin gene. What are the chances that a baby will have sickle cell trait or sickle cell disease? The most important thing to know about having SCT is that you could have a baby with SCD if your partner also has an abnormal hemoglobin gene. Who is affected by sickle cell trait? SCT is more common among people whose ancestors come from [place], the [place], [place], and [place], but anyone can have SCT.  1 in 12 blacks or African Americans in the [place] has SCT. If both parents have SCT,  each child that they have together has a  1 in 2 (50%) chance of having SCT. Children with SCT will not have symptoms of SCD, but they can pass SCT on to their children.  1 in 4 (25%) chance of having sickle cell anemia, one of several types of SCD. Sickle cell anemia is a serious medical condition.  1 in 4 (25%) chance that they will not have SCD or SCT. If one parent has SCT and the other parent has another abnormal hemoglobin gene (like hemoglobin C trait or beta-thalassemia trait), each of  their children has a  1 in 2 (50%) chance of having SCT.  1 in 4 (25%) chance of having SCD (not sickle cell anemia). These other types of SCD can be more or less severe depending on the specific abnormal hemoglobin gene.  1 in 4 (25%) chance that they will not have SCD or SCT. If only one parent has SCT, each of their children has a  1 in 2 (50%) chance of having SCT.  1 in 2 (50%) chance that they will not have SCT. What health problems might occur in people with sickle cell trait? Most people with SCT do not have any health problems caused by sickle cell trait. However, there are a few, rare health problems that may potentially be related to SCT. For example, if people with SCT have pain when traveling to or exercising at high altitudes, they should tell their healthcare provider. People with SCT and eye trauma should seek out medical attention and inform the physician about the trait status. People with SCT should drink plenty of water during exercise. People with SCT should contact and inform their doctor if they notice blood in their urine. To find out more about SCT and to get specific answers to your questions, call your healthcare provider. How will a person know if he or she has sickle cell trait? To find out if you have SCT, your doctor needs to order a blood test. If you find out you and/or your loved one has SCT, talk to your healthcare provider and/or a genetic counselor about what that means. It is important that you know what SCT is and how it can affect you and your family. For more information visit: [email or web address] [place]. Department of Health and Geneticist, molecular for Disease Control and Prevention American Society of Hematology SCDAA "Break The Sickle Cycle" This information is not intended to replace advice given to you by your health care provider. Make sure you discuss any questions you have with your health care provider. Document Revised: 09/02/2020 Document  Reviewed: 09/02/2020 Elsevier Patient Education  2021 [employer].

## 2020-12-31 ENCOUNTER — Other Ambulatory Visit: Payer: Self-pay | Admitting: Family Medicine

## 2020-12-31 ENCOUNTER — Ambulatory Visit
Admission: RE | Admit: 2020-12-31 | Discharge: 2020-12-31 | Disposition: A | Discharge status: Home / Self Care | Payer: Medicare Other | Source: Ambulatory Visit | Attending: Family Medicine | Admitting: Family Medicine

## 2020-12-31 DIAGNOSIS — I82812 Embolism and thrombosis of superficial veins of left lower extremities: Secondary | ICD-10-CM

## 2020-12-31 DIAGNOSIS — M7989 Other specified soft tissue disorders: Secondary | ICD-10-CM

## 2021-01-09 ENCOUNTER — Telehealth: Payer: Self-pay | Admitting: Emergency Medicine

## 2021-01-09 NOTE — Telephone Encounter (Signed)
Ultrasound request for a consult or for a test? Please clarify. Spoke with [facility]. Please advise on her direct line of [phone]

## 2021-01-12 NOTE — Telephone Encounter (Signed)
Called and LM to discuss. Stated this was for referral if more information needed they could call or fax request

## 2021-01-12 NOTE — Telephone Encounter (Signed)
Venous specialist states he doesn't meet criteria for consult with them they want to know why he needs to be seen as the spider bite is insufficient. States if DVT testing is done first and shows signs then they could see him but at this time they cannot schedule

## 2021-01-12 NOTE — Telephone Encounter (Signed)
Jerome Lee called back stated she needed more clarity on if we wanted pt to have DVT study done at their location she would juts like a call back on her direct line [phone]. Please advise.

## 2021-01-12 NOTE — Telephone Encounter (Signed)
Per the Venous scan: Patient's palpable abnormality corresponds to occlusive thrombus within the left great saphenous vein at the level of the distal left thigh through the proximal calf. Vein is partially occluded within the mid to distal left calf. Recommend dedicated venous Doppler ultrasound to assess for the presence of a thrombus within the deep venous system of the left lower extremity.

## 2021-01-13 NOTE — Telephone Encounter (Signed)
LM for Chanda to give her reasoning from the venous scan as documented by K.Just.

## 2021-01-19 NOTE — Telephone Encounter (Signed)
called again to discuss lm

## 2021-01-19 NOTE — Telephone Encounter (Signed)
Venous called back discussed scan and they will call and schedule pt follow up after that as well with [place]

## 2021-01-26 ENCOUNTER — Other Ambulatory Visit: Payer: Self-pay | Admitting: *Deleted

## 2021-01-26 DIAGNOSIS — I824Y2 Acute embolism and thrombosis of unspecified deep veins of left proximal lower extremity: Secondary | ICD-10-CM

## 2021-02-03 ENCOUNTER — Other Ambulatory Visit: Payer: Self-pay

## 2021-02-03 ENCOUNTER — Ambulatory Visit (INDEPENDENT_AMBULATORY_CARE_PROVIDER_SITE_OTHER): Payer: Medicare Other | Admitting: Physician Assistant

## 2021-02-03 ENCOUNTER — Ambulatory Visit (HOSPITAL_COMMUNITY)
Admission: RE | Admit: 2021-02-03 | Discharge: 2021-02-03 | Disposition: A | Discharge status: Home / Self Care | Payer: Medicare Other | Source: Ambulatory Visit | Attending: Vascular Surgery | Admitting: Vascular Surgery

## 2021-02-03 VITALS — BP 118/74 | HR 83 | Temp 98.1°F | Resp 18 | Ht 65.0 in | Wt 220.0 lb

## 2021-02-03 DIAGNOSIS — I824Y2 Acute embolism and thrombosis of unspecified deep veins of left proximal lower extremity: Secondary | ICD-10-CM | POA: Insufficient documentation

## 2021-02-03 DIAGNOSIS — I82812 Embolism and thrombosis of superficial veins of left lower extremities: Secondary | ICD-10-CM

## 2021-02-03 NOTE — Progress Notes (Signed)
[facility]           [facility] [place]  History and Physical   Jerome Lee is a 70 y.o. male who was referred for u/s to check for DVT in the left leg.  Pt states that last month, he was working out in the yard and thought he had been bitten by an insect or ants.  He went to his PCP and was found to have a clot in the GSV and felt he needed to be evaluated to make sure he did not have a DVT.  Pt denies any swelling or pain in the left leg.   He states the area of the GSV that that had the clot is not bothersome to him now.   He states that about 20 years ago, he broke his left ankle and was told to go home and prop his leg up.  He did develop a DVT in the left leg but has not had any trouble since then.     He states that he needs to call his PCP to have his left shoulder checked out.  He states he was watching football and jumped up and when he went to sit back down, the chair had moved and he fell.  He denies any unilateral weakness, numbness or paralysis.    He is an Psychologist, counselling.  He played baseball (2nd base) when he was younger on a scholarship.  He has three children.  The pt is on a statin for cholesterol management.  The pt is on a daily aspirin.   Other AC:  none The pt is on ARB for hypertension.   The pt is not diabetic.   Tobacco hx:  never   Past Medical History:  Diagnosis Date  . Full-thickness skin loss due to burn (third degree NOS), unspecified site   . Morbid obesity ([place])   . Numbness   . Weakness     Past Surgical History:  Procedure Laterality Date  . CHOLECYSTECTOMY    . FRACTURE SURGERY    . GALLBLADDER SURGERY     2004    Social History   Socioeconomic History  . Marital status: Widowed    Spouse name: Not on file  . Number of children: Not on file  . Years of education: Not on file  . Highest education level: Not on file  Occupational History  . Not on file  Tobacco Use  . Smoking status: Never Smoker  .  Smokeless tobacco: Never Used  Vaping Use  . Vaping Use: Never used  Substance and Sexual Activity  . Alcohol use: Yes  . Drug use: No  . Sexual activity: Not on file  Other Topics Concern  . Not on file  Social History Narrative  . Not on file   Social Determinants of Health   Financial Resource Strain: Not on file  Food Insecurity: Not on file  Transportation Needs: Not on file  Physical Activity: Not on file  Stress: Not on file  Social Connections: Not on file  Intimate Partner Violence: Not on file    Family History:  Denies any history of AAA   Current Outpatient Medications  Medication Sig Dispense Refill  . acetaminophen (TYLENOL) 500 MG tablet Take 500 mg by mouth every 6 (six) hours as needed.    Marland Kitchen aspirin 81 MG tablet Take 81 mg by mouth daily.    . folic acid (FOLVITE)  1 MG tablet Take 1 mg by mouth daily.    . hydrochlorothiazide (HYDRODIURIL) 12.5 MG tablet Take 1 tablet (12.5 mg total) by mouth daily. 90 tablet 3  . methotrexate (RHEUMATREX) 2.5 MG tablet TAKE 4 TABLETS BY MOUTH ONCE A WEEK    . rosuvastatin (CRESTOR) 10 MG tablet Take 1 tablet (10 mg total) by mouth daily. 90 tablet 3  . valsartan (DIOVAN) 80 MG tablet Take 1 tablet (80 mg total) by mouth daily. 90 tablet 3   No current facility-administered medications for this visit.    Allergies  Allergen Reactions  . Penicillins Rash    Full body mottled rash    REVIEW OF SYSTEMS:   [X]  denotes positive finding, [ ]  denotes negative finding Cardiac  Comments:  Chest pain or chest pressure:    Shortness of breath upon exertion:    Short of breath when lying flat:    Irregular heart rhythm:        Vascular    Pain in calf, thigh, or hip brought on by ambulation:    Pain in feet at night that wakes you up from your sleep:     Blood clot in your veins: x See HPI  Leg swelling:         Pulmonary    Oxygen at home:    Productive cough:     Wheezing:         Neurologic    Sudden weakness  in arms or legs:     Sudden numbness in arms or legs:     Sudden onset of difficulty speaking or slurred speech:    Temporary loss of vision in one eye:     Problems with dizziness:         Gastrointestinal    Blood in stool:     Vomited blood:         Genitourinary    Burning when urinating:     Blood in urine:        Psychiatric    Major depression:         Hematologic    Bleeding problems:    Problems with blood clotting too easily:        Skin    Rashes or ulcers:        Constitutional    Fever or chills:      PHYSICAL EXAMINATION:  Today's Vitals   02/03/21 0925  BP: 118/74  Pulse: 83  Resp: 18  Temp: 98.1 F (36.7 C)  TempSrc: Temporal  SpO2: 99%  Weight: 220 lb (99.8 kg)  Height: 5\' 5"  (1.651 m)   Body mass index is 36.61 kg/m.   General:  WDWN in NAD; vital signs documented above Gait: Not observed [name]: WNL, normocephalic Pulmonary: normal non-labored breathing without wheezing Cardiac: regular HR; without carotid bruits Abdomen: soft, NT, no masses; aortic pulse is not palpable; he does have a hernia present. Skin: without rashes Vascular Exam/Pulses:  Right Left  Radial 2+ (normal) 2+ (normal)  DP 2+ (normal) 2+ (normal)  PT 2+ (normal) 2+ (normal)   Extremities: without ischemic changes, without cellulitis; without open wounds; without skin color changes Musculoskeletal: no muscle wasting or atrophy  Neurologic: A&O X 3;  moving all extremities equally Psychiatric:  The pt has Normal affect.   Non-Invasive Vascular Imaging:   Venous duplex on 02/03/2021: RIGHT:  - No evidence of common femoral vein obstruction.    LEFT:  - There is no evidence of deep vein  thrombosis in the lower extremity.  - Age indeterminate superficial thrombosis noted in the GSV, distal thigh  to proximal calf.    Jerome Lee is a 70 y.o. male who presents with: superficial thrombus of the GSV in the distal thigh to proximal calf referred for further  evaluation for DVT  -Pt's duplex today reveals there is no evidence of DVT -recommend continuing asa -he does have remote hx of LLE DVT ~ 20 years ago after ankle fracture.  He does not have any post thrombotic symptoms.  -recommended compression if he has any swelling, elevation and weight loss.   -he will f/u as needed.   Leontine Locket, [facility] 02/03/2021 10:06 AM  Clinic MD:  Stanford Breed

## 2021-02-05 ENCOUNTER — Other Ambulatory Visit: Payer: Self-pay

## 2021-02-05 ENCOUNTER — Encounter: Payer: Self-pay | Admitting: Emergency Medicine

## 2021-02-05 ENCOUNTER — Ambulatory Visit (INDEPENDENT_AMBULATORY_CARE_PROVIDER_SITE_OTHER): Payer: Medicare Other | Admitting: Emergency Medicine

## 2021-02-05 VITALS — BP 120/69 | HR 60 | Temp 98.0°F | Resp 16 | Ht 65.0 in | Wt 222.0 lb

## 2021-02-05 DIAGNOSIS — Z1211 Encounter for screening for malignant neoplasm of colon: Secondary | ICD-10-CM | POA: Diagnosis not present

## 2021-02-05 DIAGNOSIS — M25512 Pain in left shoulder: Secondary | ICD-10-CM

## 2021-02-05 DIAGNOSIS — Z23 Encounter for immunization: Secondary | ICD-10-CM

## 2021-02-05 DIAGNOSIS — S40012A Contusion of left shoulder, initial encounter: Secondary | ICD-10-CM

## 2021-02-05 MED ORDER — TRAMADOL HCL 50 MG PO TABS: 50.0000 mg | ORAL_TABLET | Freq: Three times a day (TID) | ORAL | 0 refills | Status: AC | PRN

## 2021-02-05 NOTE — Patient Instructions (Addendum)
If you have lab work done today you will be contacted with your lab results within the next 2 weeks.  If you have not heard from [place] then please contact us. The fastest way to get your results is to register for My Chart.   IF you received an x-ray today, you will receive an invoice from [facility]. Please contact [facility] at [phone] with questions or concerns regarding your invoice.   IF you received labwork today, you will receive an invoice from [place]. Please contact [facility] at [phone] with questions or concerns regarding your invoice.   Our billing staff will not be able to assist you with questions regarding bills from these companies.  You will be contacted with the lab results as soon as they are available. The fastest way to get your results is to activate your My Chart account. Instructions are located on the last page of this paperwork. If you have not heard from [place] regarding the results in 2 weeks, please contact this office.     Shoulder Pain Many things can cause shoulder pain, including:  An injury.  Moving the shoulder in the same way again and again (overuse).  Joint pain (arthritis). Pain can come from:  Swelling and irritation (inflammation) of any part of the shoulder.  An injury to the shoulder joint.  An injury to: ? Tissues that connect muscle to bone (tendons). ? Tissues that connect bones to each other (ligaments). ? Bones. Follow these instructions at home: Watch for changes in your symptoms. Let your doctor know about them. Follow these instructions to help with your pain. If you have a sling:  Wear the sling as told by your doctor. Remove it only as told by your doctor.  Loosen the sling if your fingers: ? Tingle. ? Become numb. ? Turn cold and blue.  Keep the sling clean.  If the sling is not waterproof: ? Do not let it get wet. ? Take the sling off when you shower or bathe. Managing pain, stiffness,  and swelling  If told, put ice on the painful area: ? Put ice in a plastic bag. ? Place a towel between your skin and the bag. ? Leave the ice on for 20 minutes, 2-3 times a day. Stop putting ice on if it does not help with the pain.  Squeeze a soft ball or a foam pad as much as possible. This prevents swelling in the shoulder. It also helps to strengthen the arm.   General instructions  Take over-the-counter and prescription medicines only as told by your doctor.  Keep all follow-up visits as told by your doctor. This is important. Contact a doctor if:  Your pain gets worse.  Medicine does not help your pain.  You have new pain in your arm, hand, or fingers. Get help right away if:  Your arm, hand, or fingers: ? Tingle. ? Are numb. ? Are swollen. ? Are painful. ? Turn white or blue. Summary  Shoulder pain can be caused by many things. These include injury, moving the shoulder in the same away again and again, and joint pain.  Watch for changes in your symptoms. Let your doctor know about them.  This condition may be treated with a sling, ice, and pain medicine.  Contact your doctor if the pain gets worse or you have new pain. Get help right away if your arm, hand, or fingers tingle or get numb, swollen, or painful.  Keep all follow-up  visits as told by your doctor. This is important. This information is not intended to replace advice given to you by your health care provider. Make sure you discuss any questions you have with your health care provider. Document Revised: 06/13/2018 Document Reviewed: 06/13/2018 Elsevier Patient Education  2021 [employer].

## 2021-02-05 NOTE — Progress Notes (Signed)
Jerome Lee 70 y.o.   Chief Complaint  Patient presents with  . Shoulder Pain    Per patient pain left shoulder after falling from chair when it moved    HISTORY OF PRESENT ILLNESS: This is a 70 y.o. male complaining of persistent left shoulder pain since injury 2 weeks ago when he fell off a chair. No other injuries or significant symptoms. Has history of osteoarthritis on weekly methotrexate.  HPI   Prior to Admission medications   Medication Sig Start Date End Date Taking? Authorizing Provider  acetaminophen (TYLENOL) 500 MG tablet Take 500 mg by mouth every 6 (six) hours as needed.   Yes [provider]  aspirin 81 MG tablet Take 81 mg by mouth daily.   Yes [provider]  folic acid (FOLVITE) 1 MG tablet Take 1 mg by mouth daily. 12/23/18  Yes [provider]  hydrochlorothiazide (HYDRODIURIL) 12.5 MG tablet Take 1 tablet (12.5 mg total) by mouth daily. 12/15/20  Yes Just, Laurita Quint, FNP  methotrexate (RHEUMATREX) 2.5 MG tablet TAKE 4 TABLETS BY MOUTH ONCE A WEEK 01/13/19  Yes [provider]  rosuvastatin (CRESTOR) 10 MG tablet Take 1 tablet (10 mg total) by mouth daily. 12/16/20  Yes Just, Laurita Quint, FNP  valsartan (DIOVAN) 80 MG tablet Take 1 tablet (80 mg total) by mouth daily. 12/15/20  Yes Just, Laurita Quint, FNP    Allergies  Allergen Reactions  . Penicillins Rash    Full body mottled rash    Patient Active Problem List   Diagnosis Date Noted  . Essential hypertension 12/15/2020  . Primary osteoarthritis of right shoulder 03/20/2018  . Morbid obesity ([place])     Past Medical History:  Diagnosis Date  . Full-thickness skin loss due to burn (third degree NOS), unspecified site   . Morbid obesity ([place])   . Numbness   . Weakness     Past Surgical History:  Procedure Laterality Date  . CHOLECYSTECTOMY    . FRACTURE SURGERY    . GALLBLADDER SURGERY     2004    Social History   Socioeconomic History  . Marital status: Widowed     Spouse name: Not on file  . Number of children: Not on file  . Years of education: Not on file  . Highest education level: Not on file  Occupational History  . Not on file  Tobacco Use  . Smoking status: Never Smoker  . Smokeless tobacco: Never Used  Vaping Use  . Vaping Use: Never used  Substance and Sexual Activity  . Alcohol use: Yes  . Drug use: No  . Sexual activity: Not on file  Other Topics Concern  . Not on file  Social History Narrative  . Not on file   Social Determinants of Health   Financial Resource Strain: Not on file  Food Insecurity: Not on file  Transportation Needs: Not on file  Physical Activity: Not on file  Stress: Not on file  Social Connections: Not on file  Intimate Partner Violence: Not on file    History reviewed. No pertinent family history.   Review of Systems  Constitutional: Negative.  Negative for chills and fever.  [name]: Negative.  Negative for congestion and sore throat.   Respiratory: Negative.  Negative for cough and shortness of breath.   Cardiovascular: Negative.  Negative for chest pain and palpitations.  Gastrointestinal: Negative.  Negative for abdominal pain, diarrhea, nausea and vomiting.  Genitourinary: Negative.  Negative for dysuria and  hematuria.  Musculoskeletal: Positive for joint pain. Negative for myalgias.       Left shoulder pain  Skin: Negative.  Negative for rash.  Neurological: Negative.  Negative for dizziness and headaches.  All other systems reviewed and are negative.    Physical Exam Vitals reviewed.  Constitutional:      Appearance: Normal appearance.  [name]:     Head: Normocephalic.  Eyes:     Extraocular Movements: Extraocular movements intact.     Conjunctiva/sclera: Conjunctivae normal.     Pupils: Pupils are equal, round, and reactive to light.  Cardiovascular:     Rate and Rhythm: Normal rate and regular rhythm.     Pulses: Normal pulses.     Heart sounds: Normal heart sounds.   Pulmonary:     Effort: Pulmonary effort is normal.     Breath sounds: Normal breath sounds.  Musculoskeletal:     Cervical back: Normal range of motion and neck supple.  Skin:    General: Skin is warm and dry.     Capillary Refill: Capillary refill takes less than 2 seconds.  Neurological:     General: No focal deficit present.     Mental Status: He is alert and oriented to person, place, and time.  Psychiatric:        Mood and Affect: Mood normal.        Behavior: Behavior normal.      ASSESSMENT & PLAN: Jerome Lee was seen today for shoulder pain.  Diagnoses and all orders for this visit:  Acute pain of left shoulder -     traMADol (ULTRAM) 50 MG tablet; Take 1 tablet (50 mg total) by mouth every 8 (eight) hours as needed for up to 5 days.  Contusion of left shoulder, initial encounter  Need for shingles vaccine -     Varicella-zoster vaccine IM    Patient Instructions       If you have lab work done today you will be contacted with your lab results within the next 2 weeks.  If you have not heard from [place] then please contact us. The fastest way to get your results is to register for My Chart.   IF you received an x-ray today, you will receive an invoice from [facility]. Please contact [facility] at [phone] with questions or concerns regarding your invoice.   IF you received labwork today, you will receive an invoice from [place]. Please contact [facility] at [phone] with questions or concerns regarding your invoice.   Our billing staff will not be able to assist you with questions regarding bills from these companies.  You will be contacted with the lab results as soon as they are available. The fastest way to get your results is to activate your My Chart account. Instructions are located on the last page of this paperwork. If you have not heard from [place] regarding the results in 2 weeks, please contact this office.     Shoulder Pain Many  things can cause shoulder pain, including:  An injury.  Moving the shoulder in the same way again and again (overuse).  Joint pain (arthritis). Pain can come from:  Swelling and irritation (inflammation) of any part of the shoulder.  An injury to the shoulder joint.  An injury to: ? Tissues that connect muscle to bone (tendons). ? Tissues that connect bones to each other (ligaments). ? Bones. Follow these instructions at home: Watch for changes in your symptoms. Let your doctor know about them. Follow  these instructions to help with your pain. If you have a sling:  Wear the sling as told by your doctor. Remove it only as told by your doctor.  Loosen the sling if your fingers: ? Tingle. ? Become numb. ? Turn cold and blue.  Keep the sling clean.  If the sling is not waterproof: ? Do not let it get wet. ? Take the sling off when you shower or bathe. Managing pain, stiffness, and swelling  If told, put ice on the painful area: ? Put ice in a plastic bag. ? Place a towel between your skin and the bag. ? Leave the ice on for 20 minutes, 2-3 times a day. Stop putting ice on if it does not help with the pain.  Squeeze a soft ball or a foam pad as much as possible. This prevents swelling in the shoulder. It also helps to strengthen the arm.   General instructions  Take over-the-counter and prescription medicines only as told by your doctor.  Keep all follow-up visits as told by your doctor. This is important. Contact a doctor if:  Your pain gets worse.  Medicine does not help your pain.  You have new pain in your arm, hand, or fingers. Get help right away if:  Your arm, hand, or fingers: ? Tingle. ? Are numb. ? Are swollen. ? Are painful. ? Turn white or blue. Summary  Shoulder pain can be caused by many things. These include injury, moving the shoulder in the same away again and again, and joint pain.  Watch for changes in your symptoms. Let your doctor know  about them.  This condition may be treated with a sling, ice, and pain medicine.  Contact your doctor if the pain gets worse or you have new pain. Get help right away if your arm, hand, or fingers tingle or get numb, swollen, or painful.  Keep all follow-up visits as told by your doctor. This is important. This information is not intended to replace advice given to you by your health care provider. Make sure you discuss any questions you have with your health care provider. Document Revised: 06/13/2018 Document Reviewed: 06/13/2018 Elsevier Patient Education  2021 [employer]      Agustina Caroli, MD [facility]

## 2021-02-11 ENCOUNTER — Encounter: Payer: Self-pay | Admitting: Internal Medicine

## 2021-02-11 ENCOUNTER — Emergency Department (HOSPITAL_COMMUNITY)
Admission: EM | Admit: 2021-02-11 | Discharge: 2021-02-11 | Disposition: A | Discharge status: Home / Self Care | Payer: Medicare Other | Attending: Emergency Medicine | Admitting: Emergency Medicine

## 2021-02-11 ENCOUNTER — Emergency Department (HOSPITAL_COMMUNITY): Payer: Medicare Other

## 2021-02-11 ENCOUNTER — Encounter (HOSPITAL_COMMUNITY): Payer: Self-pay | Admitting: Emergency Medicine

## 2021-02-11 DIAGNOSIS — M25512 Pain in left shoulder: Secondary | ICD-10-CM | POA: Insufficient documentation

## 2021-02-11 DIAGNOSIS — Z7982 Long term (current) use of aspirin: Secondary | ICD-10-CM | POA: Diagnosis not present

## 2021-02-11 DIAGNOSIS — Z79899 Other long term (current) drug therapy: Secondary | ICD-10-CM | POA: Diagnosis not present

## 2021-02-11 DIAGNOSIS — I10 Essential (primary) hypertension: Secondary | ICD-10-CM | POA: Insufficient documentation

## 2021-02-11 DIAGNOSIS — W07XXXA Fall from chair, initial encounter: Secondary | ICD-10-CM | POA: Diagnosis not present

## 2021-02-11 MED ORDER — DICLOFENAC SODIUM 1 % EX GEL: 4.0000 g | Freq: Four times a day (QID) | CUTANEOUS | 2 refills | Status: DC
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
## 2021-02-11 NOTE — ED Triage Notes (Signed)
Patient complains of left shoulder pain after falling a few weeks ago. Patient was seen at PCP office for same when fall occurred, pain has not resolved. Patient alert, oriented, and in no apparent distress at this time.

## 2021-02-11 NOTE — Discharge Instructions (Signed)
You have been seen today for a shoulder injury. There were no acute abnormalities on the x-rays, including no sign of fracture or dislocation, however, there could be injuries to the soft tissues, such as the ligaments or tendons that are not seen on xrays. There could also be what are called occult fractures that are small fractures not seen on xray. Ibuprofen: Take 400 mg of ibuprofen every 6 hours for the next 3 days. After this time, this medication may be used as needed for pain. Take this medication with food to avoid upset stomach. Acetaminophen (generic for Tylenol): Should you continue to have additional pain while taking the ibuprofen or naproxen, you may add in acetaminophen as needed. Your daily total maximum amount of acetaminophen from all sources should be limited to 4000mg /day for persons without liver problems, or 2000mg /day for those with liver problems. Ice: May apply ice to the area over the next 24 hours for 15 minutes at a time to reduce swelling. Elevation: Keep the extremity elevated as often as possible to reduce pain and inflammation. Support: Wear the sling for support and comfort. Wear this until pain resolves.  Be sure to take the arm out of the sling and work on range of motion in the shoulder multiple times throughout the day to prevent stiffening in the shoulder. Follow up: Follow-up with the orthopedic specialist on this matter.  Call to make an appointment. Return: Return to the ED for numbness, weakness, increasing pain, overall worsening symptoms, loss of function, or if symptoms are not improving, you have tried to follow up with the orthopedic specialist, and have been unable to do so.  For prescription assistance, may try using prescription discount sites or apps, such as goodrx.com or Good Rx smart phone app.

## 2021-02-17 ENCOUNTER — Other Ambulatory Visit: Payer: Self-pay | Admitting: Orthopedic Surgery

## 2021-02-17 DIAGNOSIS — M25512 Pain in left shoulder: Secondary | ICD-10-CM

## 2021-03-19 ENCOUNTER — Ambulatory Visit
Admission: RE | Admit: 2021-03-19 | Discharge: 2021-03-19 | Disposition: A | Discharge status: Home / Self Care | Payer: Medicare Other | Source: Ambulatory Visit | Attending: Orthopedic Surgery | Admitting: Orthopedic Surgery

## 2021-03-19 ENCOUNTER — Other Ambulatory Visit: Payer: Self-pay

## 2021-03-19 DIAGNOSIS — M25512 Pain in left shoulder: Secondary | ICD-10-CM

## 2021-03-19 MED ORDER — IOPAMIDOL (ISOVUE-M 200) INJECTION 41%
12.0000 mL | Freq: Once | INTRAMUSCULAR | Status: AC
  Administered 2021-03-19: 12 mL via INTRA_ARTICULAR

## 2021-04-03 ENCOUNTER — Other Ambulatory Visit: Payer: Self-pay

## 2021-04-03 ENCOUNTER — Ambulatory Visit (AMBULATORY_SURGERY_CENTER): Payer: Self-pay

## 2021-04-03 VITALS — Ht 65.0 in | Wt 228.0 lb

## 2021-04-03 DIAGNOSIS — Z1211 Encounter for screening for malignant neoplasm of colon: Secondary | ICD-10-CM

## 2021-04-03 MED ORDER — SUTAB 1479-225-188 MG PO TABS
12.0000 | ORAL_TABLET | ORAL | 0 refills | Status: DC
Start: 2021-04-03 — End: 2021-04-17

## 2021-04-03 NOTE — Progress Notes (Signed)
No allergies to soy or egg Pt is not on blood thinners or diet pills Denies issues with sedation/intubation Denies atrial flutter/fib Denies constipation   Emmi instructions given to pt  Pt is aware of Covid safety and care partner requirements.  

## 2021-04-07 ENCOUNTER — Ambulatory Visit (INDEPENDENT_AMBULATORY_CARE_PROVIDER_SITE_OTHER): Payer: Medicare Other | Admitting: Family Medicine

## 2021-04-07 ENCOUNTER — Other Ambulatory Visit: Payer: Self-pay

## 2021-04-07 VITALS — Ht 65.0 in | Wt 230.6 lb

## 2021-04-07 DIAGNOSIS — I10 Essential (primary) hypertension: Secondary | ICD-10-CM

## 2021-04-17 ENCOUNTER — Encounter: Payer: Self-pay | Admitting: Internal Medicine

## 2021-04-17 ENCOUNTER — Ambulatory Visit (AMBULATORY_SURGERY_CENTER): Payer: Medicare Other | Admitting: Internal Medicine

## 2021-04-17 ENCOUNTER — Other Ambulatory Visit: Payer: Self-pay

## 2021-04-17 VITALS — BP 114/68 | HR 71 | Temp 97.5°F | Resp 18 | Ht 65.0 in | Wt 228.0 lb

## 2021-04-17 DIAGNOSIS — Z1211 Encounter for screening for malignant neoplasm of colon: Secondary | ICD-10-CM | POA: Diagnosis not present

## 2021-04-17 DIAGNOSIS — D120 Benign neoplasm of cecum: Secondary | ICD-10-CM | POA: Diagnosis not present

## 2021-04-17 DIAGNOSIS — D123 Benign neoplasm of transverse colon: Secondary | ICD-10-CM

## 2021-04-17 MED ORDER — SODIUM CHLORIDE 0.9 % IV SOLN: 500.0000 mL | Freq: Once | INTRAVENOUS | Status: DC

## 2021-04-17 NOTE — Progress Notes (Signed)
Called to room to assist during endoscopic procedure.  Patient ID and intended procedure confirmed with present staff. Received instructions for my participation in the procedure from the performing physician.  

## 2021-04-17 NOTE — Progress Notes (Signed)
PT taken to PACU. Monitors in place. VSS. Report given to RN. 

## 2021-04-17 NOTE — Progress Notes (Signed)
Medical history reviewed. VS assessed by [facility]

## 2021-04-17 NOTE — Patient Instructions (Signed)
Please read handouts provided. Continue present medications. Await pathology results.   YOU HAD AN ENDOSCOPIC PROCEDURE TODAY AT THE [facility]:   Refer to the procedure report that was given to you for any specific questions about what was found during the examination.  If the procedure report does not answer your questions, please call your gastroenterologist to clarify.  If you requested that your care partner not be given the details of your procedure findings, then the procedure report has been included in a sealed envelope for you to review at your convenience later.  YOU SHOULD EXPECT: Some feelings of bloating in the abdomen. Passage of more gas than usual.  Walking can help get rid of the air that was put into your GI tract during the procedure and reduce the bloating. If you had a lower endoscopy (such as a colonoscopy or flexible sigmoidoscopy) you may notice spotting of blood in your stool or on the toilet paper. If you underwent a bowel prep for your procedure, you may not have a normal bowel movement for a few days.  Please Note:  You might notice some irritation and congestion in your nose or some drainage.  This is from the oxygen used during your procedure.  There is no need for concern and it should clear up in a day or so.  SYMPTOMS TO REPORT IMMEDIATELY:  Following lower endoscopy (colonoscopy or flexible sigmoidoscopy):  Excessive amounts of blood in the stool  Significant tenderness or worsening of abdominal pains  Swelling of the abdomen that is new, acute  Fever of 100F or higher   For urgent or emergent issues, a gastroenterologist can be reached at any hour by calling [phone]. Do not use MyChart messaging for urgent concerns.    DIET:  We do recommend a small meal at first, but then you may proceed to your regular diet.  Drink plenty of fluids but you should avoid alcoholic beverages for 24 hours.  ACTIVITY:  You should plan to take it easy  for the rest of today and you should NOT DRIVE or use heavy machinery until tomorrow (because of the sedation medicines used during the test).    FOLLOW UP: Our staff will call the number listed on your records 48-72 hours following your procedure to check on you and address any questions or concerns that you may have regarding the information given to you following your procedure. If we do not reach you, we will leave a message.  We will attempt to reach you two times.  During this call, we will ask if you have developed any symptoms of COVID 19. If you develop any symptoms (ie: fever, flu-like symptoms, shortness of breath, cough etc.) before then, please call [phone].  If you test positive for Covid 19 in the 2 weeks post procedure, please call and report this information to us.    If any biopsies were taken you will be contacted by phone or by letter within the next 1-3 weeks.  Please call us at [phone] if you have not heard about the biopsies in 3 weeks.    SIGNATURES/CONFIDENTIALITY: You and/or your care partner have signed paperwork which will be entered into your electronic medical record.  These signatures attest to the fact that that the information above on your After Visit Summary has been reviewed and is understood.  Full responsibility of the confidentiality of this discharge information lies with you and/or your care-partner.  

## 2021-04-17 NOTE — Op Note (Signed)
[facility] Patient Name: Jerome Lee Procedure Date: 04/17/2021 9:00 AM MRN: [id number] Endoscopist: Jerene Bears , MD Age: 70 Referring MD:  Date of Birth: 1951/05/21 Gender: Male Account #: [id number] Procedure:                Colonoscopy Indications:              Screening for colorectal malignant neoplasm, Last                            colonoscopy > 10 years ago Medicines:                Monitored Anesthesia Care Procedure:                Pre-Anesthesia Assessment:                           - Prior to the procedure, a History and Physical                            was performed, and patient medications and                            allergies were reviewed. The patient's tolerance of                            previous anesthesia was also reviewed. The risks                            and benefits of the procedure and the sedation                            options and risks were discussed with the patient.                            All questions were answered, and informed consent                            was obtained. Prior Anticoagulants: The patient has                            taken no previous anticoagulant or antiplatelet                            agents. ASA Grade Assessment: II - A patient with                            mild systemic disease. After reviewing the risks                            and benefits, the patient was deemed in                            satisfactory condition to undergo the procedure.  After obtaining informed consent, the colonoscope                            was passed under direct vision. Throughout the                            procedure, the patient's blood pressure, pulse, and                            oxygen saturations were monitored continuously. The                            Olympus [id number] was introduced                            through the anus and advanced to the  the cecum,                            identified by appendiceal orifice and ileocecal                            valve. The colonoscopy was performed without                            difficulty. The patient tolerated the procedure                            well. The quality of the bowel preparation was                            good. The ileocecal valve, appendiceal orifice, and                            rectum were photographed. Scope In: 9:09:57 AM Scope Out: 9:28:05 AM Scope Withdrawal Time: 0 hours 14 minutes 5 seconds  Total Procedure Duration: 0 hours 18 minutes 8 seconds  Findings:                 The digital rectal exam was normal.                           A 3 mm polyp was found in the cecum. The polyp was                            sessile. The polyp was removed with a cold snare.                            Resection and retrieval were complete.                           A 5 mm polyp was found in the distal transverse                            colon. The polyp was sessile.  The polyp was removed                            with a cold snare. Resection and retrieval were                            complete.                           Multiple small and large-mouthed diverticula were                            found from ascending colon to sigmoid colon.                           Internal hemorrhoids were found during                            retroflexion. The hemorrhoids were medium-sized. Complications:            No immediate complications. Estimated Blood Loss:     Estimated blood loss: none. Impression:               - One 3 mm polyp in the cecum, removed with a cold                            snare. Resected and retrieved.                           - One 5 mm polyp in the distal transverse colon,                            removed with a cold snare. Resected and retrieved.                           - Diverticulosis from ascending colon to sigmoid                             colon.                           - Internal hemorrhoids. Recommendation:           - Patient has a contact number available for                            emergencies. The signs and symptoms of potential                            delayed complications were discussed with the                            patient. Return to normal activities tomorrow.                            Written discharge instructions were provided to the  patient.                           - Resume previous diet.                           - Continue present medications.                           - Await pathology results.                           - Repeat colonoscopy is recommended. The                            colonoscopy date will be determined after pathology                            results from today's exam become available for                            review. Jerene Bears, MD 04/17/2021 9:31:29 AM This report has been signed electronically.

## 2021-04-21 ENCOUNTER — Telehealth: Payer: Self-pay | Admitting: *Deleted

## 2021-04-21 NOTE — Telephone Encounter (Signed)
  Follow up Call-  Call back number 04/17/2021  Post procedure Call Back phone  # [phone]  Permission to leave phone message Yes  Some recent data might be hidden     Patient questions:  Do you have a fever, pain , or abdominal swelling? No. Pain Score  0 *  Have you tolerated food without any problems? Yes.    Have you been able to return to your normal activities? Yes.    Do you have any questions about your discharge instructions: Diet   No. Medications  No. Follow up visit  No.  Do you have questions or concerns about your Care? No.  Actions: * If pain score is 4 or above: No action needed, pain <4.  1. Have you developed a fever since your procedure? no  2.   Have you had an respiratory symptoms (SOB or cough) since your procedure? no  3.   Have you tested positive for COVID 19 since your procedure no  4.   Have you had any family members/close contacts diagnosed with the COVID 19 since your procedure?  no   If yes to any of these questions please route to Joylene John, RN and Joella Prince, RN

## 2021-04-23 ENCOUNTER — Encounter: Payer: Self-pay | Admitting: Internal Medicine

## 2021-04-27 NOTE — Progress Notes (Signed)
Appointment made in error. Patient has a PCP.

## 2021-07-26 ENCOUNTER — Encounter (HOSPITAL_COMMUNITY): Payer: Medicare Other

## 2021-07-26 ENCOUNTER — Other Ambulatory Visit: Payer: Self-pay

## 2021-07-26 ENCOUNTER — Emergency Department (HOSPITAL_COMMUNITY): Payer: Medicare Other

## 2021-07-26 ENCOUNTER — Encounter (HOSPITAL_COMMUNITY): Payer: Self-pay

## 2021-07-26 ENCOUNTER — Inpatient Hospital Stay (HOSPITAL_COMMUNITY)
Admission: EM | Admit: 2021-07-26 | Discharge: 2021-07-29 | DRG: 175 | Disposition: A | Discharge status: Home / Self Care | Payer: Medicare Other | Attending: Family Medicine | Admitting: Family Medicine

## 2021-07-26 DIAGNOSIS — D571 Sickle-cell disease without crisis: Secondary | ICD-10-CM | POA: Diagnosis present

## 2021-07-26 DIAGNOSIS — Z86711 Personal history of pulmonary embolism: Secondary | ICD-10-CM

## 2021-07-26 DIAGNOSIS — D72828 Other elevated white blood cell count: Secondary | ICD-10-CM | POA: Diagnosis present

## 2021-07-26 DIAGNOSIS — I10 Essential (primary) hypertension: Secondary | ICD-10-CM | POA: Diagnosis present

## 2021-07-26 DIAGNOSIS — M069 Rheumatoid arthritis, unspecified: Secondary | ICD-10-CM | POA: Diagnosis present

## 2021-07-26 DIAGNOSIS — E785 Hyperlipidemia, unspecified: Secondary | ICD-10-CM | POA: Diagnosis present

## 2021-07-26 DIAGNOSIS — I2609 Other pulmonary embolism with acute cor pulmonale: Secondary | ICD-10-CM

## 2021-07-26 DIAGNOSIS — I129 Hypertensive chronic kidney disease with stage 1 through stage 4 chronic kidney disease, or unspecified chronic kidney disease: Secondary | ICD-10-CM | POA: Diagnosis present

## 2021-07-26 DIAGNOSIS — Z20822 Contact with and (suspected) exposure to covid-19: Secondary | ICD-10-CM | POA: Diagnosis present

## 2021-07-26 DIAGNOSIS — I82432 Acute embolism and thrombosis of left popliteal vein: Secondary | ICD-10-CM | POA: Diagnosis present

## 2021-07-26 DIAGNOSIS — Z86718 Personal history of other venous thrombosis and embolism: Secondary | ICD-10-CM | POA: Diagnosis not present

## 2021-07-26 DIAGNOSIS — Z6838 Body mass index (BMI) 38.0-38.9, adult: Secondary | ICD-10-CM

## 2021-07-26 DIAGNOSIS — I82452 Acute embolism and thrombosis of left peroneal vein: Secondary | ICD-10-CM | POA: Diagnosis present

## 2021-07-26 DIAGNOSIS — J9601 Acute respiratory failure with hypoxia: Secondary | ICD-10-CM | POA: Diagnosis present

## 2021-07-26 DIAGNOSIS — Z7982 Long term (current) use of aspirin: Secondary | ICD-10-CM

## 2021-07-26 DIAGNOSIS — Z79899 Other long term (current) drug therapy: Secondary | ICD-10-CM

## 2021-07-26 DIAGNOSIS — E876 Hypokalemia: Secondary | ICD-10-CM | POA: Diagnosis present

## 2021-07-26 DIAGNOSIS — I2694 Multiple subsegmental pulmonary emboli without acute cor pulmonale: Secondary | ICD-10-CM

## 2021-07-26 DIAGNOSIS — N182 Chronic kidney disease, stage 2 (mild): Secondary | ICD-10-CM | POA: Diagnosis present

## 2021-07-26 DIAGNOSIS — I2699 Other pulmonary embolism without acute cor pulmonale: Principal | ICD-10-CM

## 2021-07-26 LAB — TROPONIN I (HIGH SENSITIVITY)
Troponin I (High Sensitivity): 25 ng/L — ABNORMAL HIGH (ref <18)
Troponin I (High Sensitivity): 249 ng/L (ref <18)
Troponin I (High Sensitivity): 543 ng/L (ref <18)
Troponin I (High Sensitivity): 447 ng/L (ref <18)

## 2021-07-26 LAB — COMPREHENSIVE METABOLIC PANEL
ALT: 26 U/L (ref 0–44)
AST: 25 U/L (ref 15–41)
Albumin: 3.9 g/dL (ref 3.5–5.0)
Alkaline Phosphatase: 45 U/L (ref 38–126)
Anion gap: 10 (ref 5–15)
BUN: 13 mg/dL (ref 8–23)
CO2: 24 mmol/L (ref 22–32)
Calcium: 9.4 mg/dL (ref 8.9–10.3)
Chloride: 102 mmol/L (ref 98–111)
Creatinine, Ser: 1.24 mg/dL (ref 0.61–1.24)
GFR, Estimated: >60 mL/min (ref >60)
Glucose, Bld: 108 mg/dL — ABNORMAL HIGH (ref 70–99)
Potassium: 3.4 mmol/L — ABNORMAL LOW (ref 3.5–5.1)
Sodium: 136 mmol/L (ref 135–145)
Total Bilirubin: 1.6 mg/dL — ABNORMAL HIGH (ref 0.3–1.2)
Total Protein: 7.5 g/dL (ref 6.5–8.1)

## 2021-07-26 LAB — LACTIC ACID, PLASMA
Lactic Acid, Venous: 2.7 mmol/L (ref 0.5–1.9)
Lactic Acid, Venous: 1.8 mmol/L (ref 0.5–1.9)

## 2021-07-26 LAB — CBC WITH DIFFERENTIAL/PLATELET
Abs Immature Granulocytes: 0.08 10*3/uL — ABNORMAL HIGH (ref 0.00–0.07)
Basophils Absolute: 0.1 10*3/uL (ref 0.0–0.1)
Basophils Relative: 1 %
Eosinophils Absolute: 0.3 10*3/uL (ref 0.0–0.5)
Eosinophils Relative: 3 %
HCT: 45 % (ref 39.0–52.0)
Hemoglobin: 14.6 g/dL (ref 13.0–17.0)
Immature Granulocytes: 1 %
Lymphocytes Relative: 21 %
Lymphs Abs: 2.2 10*3/uL (ref 0.7–4.0)
MCH: 29.7 pg (ref 26.0–34.0)
MCHC: 32.4 g/dL (ref 30.0–36.0)
MCV: 91.5 fL (ref 80.0–100.0)
Monocytes Absolute: 1 10*3/uL (ref 0.1–1.0)
Monocytes Relative: 10 %
Neutro Abs: 6.8 10*3/uL (ref 1.7–7.7)
Neutrophils Relative %: 64 %
Platelets: 209 10*3/uL (ref 150–400)
RBC: 4.92 MIL/uL (ref 4.22–5.81)
RDW: 14.8 % (ref 11.5–15.5)
WBC: 10.4 10*3/uL (ref 4.0–10.5)
nRBC: 0.3 % — ABNORMAL HIGH (ref 0.0–0.2)

## 2021-07-26 LAB — RESP PANEL BY RT-PCR (FLU A&B, COVID) ARPGX2
Influenza A by PCR: NEGATIVE
Influenza B by PCR: NEGATIVE
SARS Coronavirus 2 by RT PCR: NEGATIVE

## 2021-07-26 LAB — ECHOCARDIOGRAM COMPLETE: S' Lateral: 3 cm

## 2021-07-26 LAB — PROTIME-INR
INR: 1.2 (ref 0.8–1.2)
Prothrombin Time: 15.1 seconds (ref 11.4–15.2)

## 2021-07-26 LAB — D-DIMER, QUANTITATIVE: D-Dimer, Quant: >20 ug/mL-FEU — ABNORMAL HIGH (ref 0.00–0.50)

## 2021-07-26 LAB — BRAIN NATRIURETIC PEPTIDE: B Natriuretic Peptide: 9.8 pg/mL (ref 0.0–100.0)

## 2021-07-26 LAB — HIV ANTIBODY (ROUTINE TESTING W REFLEX): HIV Screen 4th Generation wRfx: NONREACTIVE

## 2021-07-26 MED ORDER — HEPARIN BOLUS VIA INFUSION
6000.0000 [IU] | Freq: Once | INTRAVENOUS | Status: AC
  Administered 2021-07-26: 6000 [IU] via INTRAVENOUS
  Filled 2021-07-26: qty 6000

## 2021-07-26 MED ORDER — HEPARIN (PORCINE) 25000 UT/250ML-% IV SOLN
1750.0000 [IU]/h | INTRAVENOUS | Status: DC
  Administered 2021-07-26: 1500 [IU]/h via INTRAVENOUS
  Administered 2021-07-27 – 2021-07-29 (×3): 1750 [IU]/h via INTRAVENOUS
  Filled 2021-07-26 (×5): qty 250

## 2021-07-26 MED ORDER — IRBESARTAN 75 MG PO TABS: 75.0000 mg | ORAL_TABLET | Freq: Every day | ORAL | Status: DC

## 2021-07-26 MED ORDER — HYDROCHLOROTHIAZIDE 25 MG PO TABS: 12.5000 mg | ORAL_TABLET | Freq: Every day | ORAL | Status: DC

## 2021-07-26 MED ORDER — ASPIRIN EC 81 MG PO TBEC
81.0000 mg | DELAYED_RELEASE_TABLET | Freq: Every day | ORAL | Status: DC
  Administered 2021-07-27 – 2021-07-29 (×3): 81 mg via ORAL
  Filled 2021-07-26 (×4): qty 1

## 2021-07-26 MED ORDER — FOLIC ACID 1 MG PO TABS
1.0000 mg | ORAL_TABLET | Freq: Every day | ORAL | Status: DC
  Administered 2021-07-27 – 2021-07-29 (×3): 1 mg via ORAL
  Filled 2021-07-26 (×4): qty 1

## 2021-07-26 MED ORDER — LABETALOL HCL 5 MG/ML IV SOLN: 10.0000 mg | INTRAVENOUS | Status: DC | PRN

## 2021-07-26 MED ORDER — IOHEXOL 350 MG/ML SOLN
75.0000 mL | Freq: Once | INTRAVENOUS | Status: AC | PRN
  Administered 2021-07-26: 75 mL via INTRAVENOUS

## 2021-07-26 MED ORDER — DICLOFENAC SODIUM 1 % EX GEL: 4.0000 g | Freq: Four times a day (QID) | CUTANEOUS | Status: DC

## 2021-07-26 MED ORDER — ACETAMINOPHEN 500 MG PO TABS: 500.0000 mg | ORAL_TABLET | Freq: Four times a day (QID) | ORAL | Status: DC | PRN

## 2021-07-26 MED ORDER — ROSUVASTATIN CALCIUM 5 MG PO TABS
10.0000 mg | ORAL_TABLET | Freq: Every day | ORAL | Status: DC
  Administered 2021-07-27 – 2021-07-29 (×3): 10 mg via ORAL
  Filled 2021-07-26 (×4): qty 2

## 2021-07-26 MED ORDER — POTASSIUM CHLORIDE CRYS ER 20 MEQ PO TBCR
40.0000 meq | EXTENDED_RELEASE_TABLET | Freq: Once | ORAL | Status: AC
  Administered 2021-07-26: 40 meq via ORAL
  Filled 2021-07-26: qty 2

## 2021-07-26 MED ORDER — HYDRALAZINE HCL 25 MG PO TABS: 25.0000 mg | ORAL_TABLET | Freq: Four times a day (QID) | ORAL | Status: DC | PRN

## 2021-07-26 MED ORDER — PERFLUTREN LIPID MICROSPHERE
1.0000 mL | INTRAVENOUS | Status: AC | PRN
  Administered 2021-07-26: 2 mL via INTRAVENOUS
  Filled 2021-07-26: qty 10
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
## 2021-07-26 NOTE — ED Notes (Signed)
Date and time results received: 07/26/21 on 1730   Test:Lactic Acid Critical Value: 2.7  Name of Provider Notified: Zhang  Orders Received: No orders at this time.

## 2021-07-26 NOTE — ED Notes (Signed)
Pt s position changed for comfort
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
## 2021-07-26 NOTE — ED Notes (Signed)
Pt appears comfortable  At present

## 2021-07-26 NOTE — ED Provider Notes (Signed)
Emergency Medicine Provider Triage Evaluation Note  Jerome Lee , a 70 y.o. male  was evaluated in triage.  Pt complains of dyspnea on exertion that began earlier today while walking up and down his stairs at home. He states he feels fine now. No hx of same. Denies chest pain. Hx of DVT in LLE in February, not currently on a blood thinner. Denies fevers, chills, cough,  body aches, recent sick contacts.   Review of Systems  Positive: + SOB Negative: - chest pain, nausea, vomiting  Physical Exam  BP (!) 154/105   Pulse (!) 118   Temp 98.5 F (36.9 C) (Oral)   Resp (!) 24   SpO2 91%  Gen:   Awake, no distress   Resp:  Normal effort  MSK:   Moves extremities without difficulty  Other:  Tachycardic. Speaking in full sentences without difficult.   Medical Decision Making  Medically screening exam initiated at 10:24 AM.  Appropriate orders placed.  ZERIC PADDOCK was informed that the remainder of the evaluation will be completed by another provider, this initial triage assessment does not replace that evaluation, and the importance of remaining in the ED until their evaluation is complete.  O2 sat dipped to 90% in triage, placed on 2L. He is also tachycardic with hx of DVT in LLE. No anticoagulated at this time. Concern for PE. Labs, EKG, and CXR ordered at this time.    Eustaquio Maize, PA-C 07/26/21 1027    Margette Fast, MD 07/26/21 1530

## 2021-07-26 NOTE — Progress Notes (Signed)
Patient seen and examined in the ED. Echo being performed, RN about to draw LA and repeat trop. No high risk sx (CP, dizziness, syncope), HR controlled in low 100s, hypertensive. Stable for the floor. Covid test ordered. Full consult note to follow.  Julian Hy, DO 07/26/21 3:53 PM [facility]

## 2021-07-26 NOTE — ED Triage Notes (Signed)
Patient complains of SOB with going up and down steps the past day. Patient denies any other associated symptoms. Patient alert and oriented, denies pain, denies resp. illness
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
## 2021-07-26 NOTE — ED Notes (Signed)
Placed pt on condom cath

## 2021-07-26 NOTE — Progress Notes (Deleted)
MRI resulted. Radiology evidence not sufficient to tell seroma from other changes. Clinically, given the mild back symptoms, suspect the changes on MRI likely seroma. Placed one call to neurosurgery waiting for a reply.
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
## 2021-07-26 NOTE — Progress Notes (Signed)
  Echocardiogram 2D Echocardiogram has been performed.  Jerome Lee 07/26/2021, 3:42 PM

## 2021-07-26 NOTE — ED Notes (Signed)
X3 seperate RN attempts to obtain repeat labs on patient. Unsuccessful. Patient difficult stick. Notified Dr. Roosevelt Locks of same.

## 2021-07-26 NOTE — Progress Notes (Signed)
Repeat exam of Jerome Lee this evening. He denies pain, dizziness, worse SOB. Per RN HR increased some once he sat up, but has otherwise been ~100.  Eating dinner, sitting up. HR 105-120, SBP 144, SpO2 98% on RA.  Trop 543 LA 2.7  Repeat trop and LA around 7pm. Would reconsider embolectomy if still rising. Will have night team follow up.  Julian Hy, DO 07/26/21 6:42 PM [facility]

## 2021-07-26 NOTE — ED Notes (Signed)
Date and time results received: 07/26/21 at 1730   Test: Troponin Critical Value: 543   Name of Provider Notified: Dr. Roosevelt Locks Orders Received? No new orders at this time.

## 2021-07-27 ENCOUNTER — Inpatient Hospital Stay (HOSPITAL_COMMUNITY): Payer: Medicare Other

## 2021-07-27 DIAGNOSIS — I2699 Other pulmonary embolism without acute cor pulmonale: Secondary | ICD-10-CM

## 2021-07-27 DIAGNOSIS — E785 Hyperlipidemia, unspecified: Secondary | ICD-10-CM

## 2021-07-27 DIAGNOSIS — I10 Essential (primary) hypertension: Secondary | ICD-10-CM

## 2021-07-27 DIAGNOSIS — M069 Rheumatoid arthritis, unspecified: Secondary | ICD-10-CM

## 2021-07-27 LAB — BASIC METABOLIC PANEL
Anion gap: 10 (ref 5–15)
BUN: 11 mg/dL (ref 8–23)
CO2: 22 mmol/L (ref 22–32)
Calcium: 9.3 mg/dL (ref 8.9–10.3)
Chloride: 104 mmol/L (ref 98–111)
Creatinine, Ser: 1.17 mg/dL (ref 0.61–1.24)
GFR, Estimated: >60 mL/min (ref >60)
Glucose, Bld: 94 mg/dL (ref 70–99)
Potassium: 3.9 mmol/L (ref 3.5–5.1)
Sodium: 136 mmol/L (ref 135–145)

## 2021-07-27 LAB — HEPARIN LEVEL (UNFRACTIONATED)
Heparin Unfractionated: 0.24 IU/mL — ABNORMAL LOW (ref 0.30–0.70)
Heparin Unfractionated: 0.51 IU/mL (ref 0.30–0.70)

## 2021-07-27 LAB — CBC
HCT: 45.7 % (ref 39.0–52.0)
Hemoglobin: 15.4 g/dL (ref 13.0–17.0)
MCH: 30.1 pg (ref 26.0–34.0)
MCHC: 33.7 g/dL (ref 30.0–36.0)
MCV: 89.4 fL (ref 80.0–100.0)
Platelets: 216 10*3/uL (ref 150–400)
RBC: 5.11 MIL/uL (ref 4.22–5.81)
RDW: 14.8 % (ref 11.5–15.5)
WBC: 14 10*3/uL — ABNORMAL HIGH (ref 4.0–10.5)
nRBC: 0.5 % — ABNORMAL HIGH (ref 0.0–0.2)

## 2021-07-27 LAB — TROPONIN I (HIGH SENSITIVITY): Troponin I (High Sensitivity): 191 ng/L (ref <18)

## 2021-07-27 MED ORDER — HEPARIN BOLUS VIA INFUSION
2000.0000 [IU] | Freq: Once | INTRAVENOUS | Status: AC
  Administered 2021-07-27: 2000 [IU] via INTRAVENOUS
  Filled 2021-07-27: qty 2000

## 2021-07-27 NOTE — Progress Notes (Signed)
BLE venous duplex has been completed.  Preliminary findings given to [place], Therapist, sports.  Results can be found under chart review under CV PROC. 07/27/2021 7:06 PM Marnita Poirier RVT, RDMS
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
## 2021-07-27 NOTE — Progress Notes (Signed)
PROGRESS NOTE    Jerome Lee  [id number] DOB: 06/06/1951 DOA: 07/26/2021 PCP: Horald Pollen, MD    Brief Narrative:  Jerome Lee was admitted to the hospital with the working diagnosis of acute pulmonary embolism.   70 year old male past medical history for rheumatoid arthritis, mixed connective tissue disease, hypertension, and dyslipidemia.  Reported 24 hours of worsening dyspnea, to the point where he became symptomatic with minimal efforts.  No cough, no fevers.  February 2022 had superficial thrombosis in left lower extremity.  On his initial physical examination oximetry was 90% on room air, blood pressure 145/95, heart rate 101, respiratory rate 24, lungs clear to auscultation, heart S1-S2, present, rhythmic, soft abdomen, right lower extremity edema.  Sodium 136, potassium 3.4, chloride 102, bicarb 24, glucose 108 BUN 13, creatinine 1.24, high sensitive troponin [phone].  White count 10.4, hemoglobin 14.6 hematocrit 45.0, platelets 209. D dimer > 20 SARS COVID 19 negative  Chest radiograph with prominent hilar vaculature  CT chest large pulmonary embolus burden affecting the distal aspects of both main pulmonary arteries.  Segmental branches in the bilateral upper lobes and bilateral lower lobes are involved.  RV/LV ratio 1.5 consistent with submassive PE.  EKG 10 bpm, left axis deviation, normal intervals, sinus rhythm, with poor R wave progression, no significant ST segment or T wave changes,.   Assessment & Plan:   Principal Problem:   Pulmonary emboli (HCC) Active Problems:   Essential hypertension   Rheumatoid arthritis (HCC)   Dyslipidemia   Acute bilateral pulmonary embolism, submassive per CT chest.  Patient now on full anticoagulation with improvement of dyspnea but not yet back to baseline.  Oxygenation is 96 to 99% on room air. Blood pressure 120 to [id number] mmHg systolic.  Echocardiogram with LV systolic function 60 to [id number], moderate LVH,  hyperdynamic apex and hypokinetic free wall (McConnell's sign). Moderate reduction in RV systolic function. RSVP 19,5 mmHg. High troponin consistent with RV strain.   Plan to continue IV heparin (parenteral anticoagulation), for the next 48 hrs, continue close follow up on hemodynamics and oxygenation.  Ok to continue monitoring on telemetry.   Patient had a provoked DVT and PE in 2005, after a right ankle fracture.   2. HTN. Blood pressure has remained stable, will continue to hold on antihypertensive to prevent hypotension in the setting of RV strain.  At home on HCTZ  3. RA.  Patient on methotrexate 15 mg q week.   4. Dyslipidemia. Continue with rosuvastatin.    5. Hypokalemia. Follow up K is 3,9 with renal function serum cr 1,17 with bicarbonate at 22 and Na 136 Continue close follow up on renal function and electrolytes.   6. Reactive leukocytosis. Wbc is 14, will continue close follow up, no signs of systemic infection.  Continue to hold on antibiotic therapy.   Patient continue to be at high risk for worsening Rv failure   Status is: Inpatient  Remains inpatient appropriate because:Inpatient level of care appropriate due to severity of illness  Dispo: The patient is from: Home              Anticipated d/c is to: Home              Patient currently is not medically stable to d/c.   Difficult to place patient No   DVT prophylaxis: IV heparin   Code Status:    full  Family Communication:   No family at the bedside    Consultants:  Pulmonary  Subjective: Patient reports improvement in dyspnea, no chest pain, no nausea or vomiting.   Objective: Vitals:   07/27/21 1100 07/27/21 1200 07/27/21 1230 07/27/21 1300  BP: 126/82 (!) 132/93 131/90 133/88  Pulse: 99 (!) 102 (!) 101 92  Resp: (!) 27 (!) 24 (!) 24 (!) 24  Temp:      TempSrc:      SpO2: 96% 99% 96% 98%  Weight:      Height:       No intake or output data in the 24 hours ending 07/27/21 1505 Filed  Weights   07/26/21 1507  Weight: 104.3 kg    Examination:   General: Not in pain or dyspnea  Neurology: Awake and alert, non focal  E ENT: no pallor, no icterus, oral mucosa moist Cardiovascular: No JVD. S1-S2 present, rhythmic, no gallops, rubs, or murmurs. Right  lower extremity edema non pitting edema. Pulmonary: positive breath sounds bilaterally, with no wheezing, rhonchi or rales. Gastrointestinal. Abdomen soft and non tender Skin. No rashes Musculoskeletal: no joint deformities     Data Reviewed: I have personally reviewed following labs and imaging studies  CBC: Recent Labs  Lab 07/26/21 1026 07/27/21 1218  WBC 10.4 14.0*  NEUTROABS 6.8  --   HGB 14.6 15.4  HCT 45.0 45.7  MCV 91.5 89.4  PLT 209 [id number]   Basic Metabolic Panel: Recent Labs  Lab 07/26/21 1026 07/27/21 1218  NA 136 136  K 3.4* 3.9  CL 102 104  CO2 24 22  GLUCOSE 108* 94  BUN 13 11  CREATININE 1.24 1.17  CALCIUM 9.4 9.3   GFR: Estimated Creatinine Clearance: 66.2 mL/min (by C-G formula based on SCr of 1.17 mg/dL). Liver Function Tests: Recent Labs  Lab 07/26/21 1026  AST 25  ALT 26  ALKPHOS 45  BILITOT 1.6*  PROT 7.5  ALBUMIN 3.9   No results for input(s): LIPASE, AMYLASE in the last 168 hours. No results for input(s): AMMONIA in the last 168 hours. Coagulation Profile: Recent Labs  Lab 07/26/21 1618  INR 1.2   Cardiac Enzymes: No results for input(s): CKTOTAL, CKMB, CKMBINDEX, TROPONINI in the last 168 hours. BNP (last 3 results) No results for input(s): PROBNP in the last 8760 hours. HbA1C: No results for input(s): HGBA1C in the last 72 hours. CBG: No results for input(s): GLUCAP in the last 168 hours. Lipid Profile: No results for input(s): CHOL, HDL, LDLCALC, TRIG, CHOLHDL, LDLDIRECT in the last 72 hours. Thyroid Function Tests: No results for input(s): TSH, T4TOTAL, FREET4, T3FREE, THYROIDAB in the last 72 hours. Anemia Panel: No results for input(s): VITAMINB12,  FOLATE, FERRITIN, TIBC, IRON, RETICCTPCT in the last 72 hours.    Radiology Studies: I have reviewed all of the imaging during this hospital visit personally     Scheduled Meds:  aspirin EC  81 mg Oral Daily   diclofenac Sodium  4 g Topical QID   folic acid  1 mg Oral Daily   rosuvastatin  10 mg Oral Daily   Continuous Infusions:  heparin 1,750 Units/hr (07/27/21 0721)     LOS: 1 day        Aiden Rao Gerome Apley, MD

## 2021-07-27 NOTE — Progress Notes (Signed)
   07/27/21 1946  Assess: MEWS Score  Temp 98.8 F (37.1 C)  BP 130/88  Pulse Rate (!) 112  Resp 18  Level of Consciousness Alert  SpO2 98 %  O2 Device Room Air  Assess: MEWS Score  MEWS Temp 0  MEWS Systolic 0  MEWS Pulse 2  MEWS RR 0  MEWS LOC 0  MEWS Score 2  MEWS Score Color Yellow  Assess: if the MEWS score is Yellow or Red  Were vital signs taken at a resting state? Yes  Focused Assessment Change from prior assessment (see assessment flowsheet)  Early Detection of Sepsis Score *See Row Information* Low  MEWS guidelines implemented *See Row Information* Yes  Treat  MEWS Interventions Other (Comment) (No intervention needed)  Take Vital Signs  Increase Vital Sign Frequency  Yellow: Q 2hr X 2 then Q 4hr X 2, if remains yellow, continue Q 4hrs  Escalate  MEWS: Escalate Yellow: discuss with charge nurse/RN and consider discussing with provider and RRT  Notify: Charge Nurse/RN  Name of Charge Nurse/RN Notified Christine  RN  Date Charge Nurse/RN Notified 07/27/21  Time Charge Nurse/RN Notified 2200  Document  Patient Outcome Stabilized after interventions (No intervention needed)  Progress note created (see row info) Yes
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
## 2021-07-27 NOTE — ED Notes (Signed)
Report called to floor [address] [place]

## 2021-07-27 NOTE — ED Notes (Signed)
Patient received lunch tray at this time.

## 2021-07-27 NOTE — ED Notes (Signed)
The pt is having some leg cramps in his rt lower leg

## 2021-07-27 NOTE — Progress Notes (Signed)
PCCM f/u  Vitals stable. Sats 97% on RA.  Notes/labs reviewed.  Pt remains in ER awaiting tele bed. On Heparin gtt per pharmacy.  Lactate and troponin trending down, much improved.   No indication at this time for IR intervention or thrombolytics.  Continue heparin gtt.   PCCM signing off, please call back if needed.   Nickolas Madrid, NP Pulmonary/Critical Care Medicine  07/27/2021  11:02 AM

## 2021-07-28 ENCOUNTER — Other Ambulatory Visit (HOSPITAL_COMMUNITY): Payer: Self-pay

## 2021-07-28 LAB — CBC
HCT: 43.3 % (ref 39.0–52.0)
Hemoglobin: 14.3 g/dL (ref 13.0–17.0)
MCH: 29.5 pg (ref 26.0–34.0)
MCHC: 33 g/dL (ref 30.0–36.0)
MCV: 89.3 fL (ref 80.0–100.0)
Platelets: 203 10*3/uL (ref 150–400)
RBC: 4.85 MIL/uL (ref 4.22–5.81)
RDW: 14.7 % (ref 11.5–15.5)
WBC: 12.9 10*3/uL — ABNORMAL HIGH (ref 4.0–10.5)
nRBC: 0.2 % (ref 0.0–0.2)

## 2021-07-28 LAB — BASIC METABOLIC PANEL
Anion gap: 8 (ref 5–15)
BUN: 13 mg/dL (ref 8–23)
CO2: 24 mmol/L (ref 22–32)
Calcium: 9.2 mg/dL (ref 8.9–10.3)
Chloride: 104 mmol/L (ref 98–111)
Creatinine, Ser: 1.33 mg/dL — ABNORMAL HIGH (ref 0.61–1.24)
GFR, Estimated: 58 mL/min — ABNORMAL LOW (ref >60)
Glucose, Bld: 96 mg/dL (ref 70–99)
Potassium: 4.1 mmol/L (ref 3.5–5.1)
Sodium: 136 mmol/L (ref 135–145)

## 2021-07-28 LAB — HEPARIN LEVEL (UNFRACTIONATED): Heparin Unfractionated: 0.42 IU/mL (ref 0.30–0.70)

## 2021-07-28 NOTE — TOC Benefit Eligibility Note (Signed)
Patient Teacher, English as a foreign language completed.    The patient is currently admitted and upon discharge could be taking Eliquis 5 mg.  The current 30 day co-pay is, $47.00.   The patient is currently admitted and upon discharge could be taking Xarelto 20 mg.  The current 30 day co-pay is, $47.00.   The patient is insured through [place], [place] Patient [place] Team Direct Number: [phone]  Fax: [phone]

## 2021-07-28 NOTE — Evaluation (Signed)
Occupational Therapy Evaluation Patient Details Name: Jerome Lee MRN: [id number] DOB: [birth year] Today's Date: 07/28/2021    History of Present Illness 70 year old male presented to ED with reported 24 hours of worsening dyspnea. PMH for rheumatoid arthritis, mixed connective tissue disease, hypertension, and dyslipidemia. Pt found to have acute bilateral pulmonary embolism, submassive per CT chest. Patient now on full anticoagulation.   Clinical Impression   Pt admitted with the above diagnoses and presents with below problem list. Pt will benefit from continued acute OT to address the below listed deficits and maximize independence with basic ADLs prior to d/c home. At baseline, pt is independent with ADLs. Pt currently is supervision level with functional mobility and transfers. Walked household distance during OT session. No SOB noted. Up in recliner at end of session. Will follow acutely.      Follow Up Recommendations  No OT follow up    Equipment Recommendations  None recommended by OT    Recommendations for Other Services       Precautions / Restrictions Restrictions Weight Bearing Restrictions: No      Mobility Bed Mobility Overal bed mobility: Modified Independent                  Transfers Overall transfer level: Needs assistance Equipment used: None Transfers: Sit to/from Stand Sit to Stand: Supervision         General transfer comment: to/from EOB and recliner. Supervision for safety. No LOB or external support needed.    Balance Overall balance assessment: No apparent balance deficits (not formally assessed)                                         ADL either performed or assessed with clinical judgement   ADL Overall ADL's : Needs assistance/impaired Eating/Feeding: Set up   Grooming: Supervision/safety;Standing   Upper Body Bathing: Set up;Sitting   Lower Body Bathing: Supervison/ safety;Sit to/from stand   Upper  Body Dressing : Set up;Sitting   Lower Body Dressing: Supervision/safety   Toilet Transfer: Supervision/safety   Toileting- Clothing Manipulation and Hygiene: Supervision/safety   Tub/ Shower Transfer: Supervision/safety   Functional mobility during ADLs: Supervision/safety (assessed household distance mobility) General ADL Comments: Pt completed household distance mobility, toilet transfer, and grooming task standing at sink. No SOB noted. Appears to be getting closer to baseline with activity tolerance     Vision         Perception     Praxis      Pertinent Vitals/Pain Pain Assessment: Faces Faces Pain Scale: Hurts a little bit Pain Location: L flank Pain Descriptors / Indicators: Discomfort Pain Intervention(s): Monitored during session     Hand Dominance     Extremity/Trunk Assessment Upper Extremity Assessment Upper Extremity Assessment: Overall WFL for tasks assessed   Lower Extremity Assessment Lower Extremity Assessment: Defer to PT evaluation       Communication Communication Communication: No difficulties   Cognition Arousal/Alertness: Awake/alert Behavior During Therapy: WFL for tasks assessed/performed Overall Cognitive Status: Within Functional Limits for tasks assessed                                     General Comments       Exercises     Shoulder Instructions      Home Living Family/patient expects to  be discharged to:: Private residence Living Arrangements: Children (2 adult children, flexible work schedules) Available Help at Discharge: Family;Available PRN/intermittently;Available 24 hours/day (multiple family close by. children have flexible work schedules) Type of Home: House Home Access: Level entry     Home Layout: Multi-level;Bed/bath upstairs Alternate Level Stairs-Number of Steps: 8 Alternate Level Stairs-Rails: Right Bathroom Shower/Tub: Walk-in shower         Home Equipment: Crutches;Cane - single  point;Walker - 4 wheels   Additional Comments: Spouse passed about 2 years ago. Pt worked 30 years at [employer]      Prior Functioning/Environment Level of Independence: Independent                 OT Problem List: Decreased activity tolerance;Decreased knowledge of precautions;Pain;Cardiopulmonary status limiting activity      OT Treatment/Interventions: Self-care/ADL training;Therapeutic exercise;Energy conservation;DME and/or AE instruction;Therapeutic activities;Balance training;Patient/family education    OT Goals(Current goals can be found in the care plan section) Acute Rehab OT Goals Patient Stated Goal: home, keep active OT Goal Formulation: With patient Time For Goal Achievement: 08/11/21 Potential to Achieve Goals: Good  OT Frequency: Min 2X/week   Barriers to D/C:            Co-evaluation              AM-PAC OT "6 Clicks" Daily Activity     Outcome Measure Help from another person eating meals?: None Help from another person taking care of personal grooming?: None Help from another person toileting, which includes using toliet, bedpan, or urinal?: None Help from another person bathing (including washing, rinsing, drying)?: A Little Help from another person to put on and taking off regular upper body clothing?: None Help from another person to put on and taking off regular lower body clothing?: None 6 Click Score: 23   End of Session Nurse Communication: Other (comment);Mobility status (NT)  Activity Tolerance: Patient tolerated treatment well Patient left: in chair;with call bell/phone within reach  OT Visit Diagnosis: Pain;Unsteadiness on feet (R26.81)                Time: [id number] OT Time Calculation (min): 22 min Charges:  OT General Charges $OT Visit: 1 Visit OT Evaluation $OT Eval Low Complexity: [place], OT Acute Rehabilitation Services Pager: [phone] Office: [phone]   Hortencia Pilar 07/28/2021, 12:25  PM
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
## 2021-07-28 NOTE — Evaluation (Signed)
Physical Therapy Evaluation Patient Details Name: Jerome Lee MRN: [id number] DOB: 07-26-1951 Today's Date: 07/28/2021   History of Present Illness  70 y.o. male presents to [facility] ED on 07/26/2021 with DOE, found to have large bilateral PE on chest CT. PMH includes rheumatoid arthritis, mixed connective tissue disease, hypertension, and dyslipidemia.  Clinical Impression  Pt presents to PT with deficits in activity tolerance and cardiopulmonary endurance, desaturating when mobilizing on room air. Pt reports mobility quality is similar to baseline with deficits due to prior RLE injury, however does continue to report SOB and endurance deficits. Pt is encouraged to ambulate multiple times daily in an effort to improve activity tolerance. PT recommends no PT or DME at the time of discharge.    Follow Up Recommendations No PT follow up    Equipment Recommendations  None recommended by PT    Recommendations for Other Services       Precautions / Restrictions Precautions Precautions: Fall Precaution Comments: monitor SpO2 Restrictions Weight Bearing Restrictions: No      Mobility  Bed Mobility Overal bed mobility: Modified Independent                  Transfers Overall transfer level: Independent Equipment used: None Transfers: Sit to/from Stand Sit to Stand: Supervision         General transfer comment: to/from EOB and recliner. Supervision for safety. No LOB or external support needed.  Ambulation/Gait Ambulation/Gait assistance: Supervision Gait Distance (Feet): 250 Feet Assistive device: None Gait Pattern/deviations: Step-through pattern Gait velocity: functional Gait velocity interpretation: 1.31 - 2.62 ft/sec, indicative of limited community ambulator General Gait Details: pt with steady step-through gait, mild reduction in stance time of RLE  Stairs Stairs: Yes Stairs assistance: Supervision Stair Management: One rail Left;Step to pattern Number of  Stairs: 5    Wheelchair Mobility    Modified Rankin (Stroke Patients Only)       Balance Overall balance assessment: No apparent balance deficits (not formally assessed)                                           Pertinent Vitals/Pain Pain Assessment: Faces Faces Pain Scale: Hurts little more Pain Location: RLE Pain Descriptors / Indicators: Grimacing Pain Intervention(s): Monitored during session    Home Living Family/patient expects to be discharged to:: Private residence Living Arrangements: Children (2 sons) Available Help at Discharge: Family;Available PRN/intermittently;Available 24 hours/day Type of Home: House Home Access: Level entry     Home Layout: Multi-level;Bed/bath upstairs Home Equipment: Crutches;Cane - single point;Walker - 4 wheels Additional Comments: Spouse passed about 2 years ago. Pt worked 30 years at [employer]    Prior Function Level of Independence: Independent         Comments: ambulation tolerance is limited at times due to old RLE injury     Hand Dominance        Extremity/Trunk Assessment   Upper Extremity Assessment Upper Extremity Assessment: Overall WFL for tasks assessed    Lower Extremity Assessment Lower Extremity Assessment: Overall WFL for tasks assessed    Cervical / Trunk Assessment Cervical / Trunk Assessment: Normal  Communication   Communication: No difficulties  Cognition Arousal/Alertness: Awake/alert Behavior During Therapy: WFL for tasks assessed/performed Overall Cognitive Status: Within Functional Limits for tasks assessed  General Comments General comments (skin integrity, edema, etc.): VSS on RA    Exercises     Assessment/Plan    PT Assessment Patient needs continued PT services  PT Problem List Decreased activity tolerance;Cardiopulmonary status limiting activity       PT Treatment Interventions DME instruction;Gait  training;Stair training;Functional mobility training;Therapeutic activities;Therapeutic exercise;Patient/family education    PT Goals (Current goals can be found in the Care Plan section)  Acute Rehab PT Goals Patient Stated Goal: to go home, increase activity tolerance PT Goal Formulation: With patient Time For Goal Achievement: 08/11/21 Potential to Achieve Goals: Good Additional Goals Additional Goal #1: Pt will report 3/10 DOE or less when ambulating for >125' on room air to indicate improved activity tolerance    Frequency Min 3X/week   Barriers to discharge        Co-evaluation               AM-PAC PT "6 Clicks" Mobility  Outcome Measure Help needed turning from your back to your side while in a flat bed without using bedrails?: None Help needed moving from lying on your back to sitting on the side of a flat bed without using bedrails?: None Help needed moving to and from a bed to a chair (including a wheelchair)?: None Help needed standing up from a chair using your arms (e.g., wheelchair or bedside chair)?: None Help needed to walk in hospital room?: A Little Help needed climbing 3-5 steps with a railing? : A Little 6 Click Score: 22    End of Session   Activity Tolerance: Patient tolerated treatment well Patient left: in chair;with call bell/phone within reach Nurse Communication: Mobility status PT Visit Diagnosis: Other abnormalities of gait and mobility (R26.89)    Time: [id number] PT Time Calculation (min) (ACUTE ONLY): 12 min   Charges:   PT Evaluation $PT Eval Low Complexity: 1 Low          Zenaida Niece, PT, DPT Acute Rehabilitation Pager: [phone]   Zenaida Niece 07/28/2021, 1:19 PM

## 2021-07-28 NOTE — Progress Notes (Signed)
PROGRESS NOTE    Jerome Lee  [id number] DOB: 09/28/1951 DOA: 07/26/2021 PCP: Horald Pollen, MD    Brief Narrative:  Jerome Lee was admitted to the hospital with the working diagnosis of acute pulmonary embolism.    70 year old male past medical history for rheumatoid arthritis, mixed connective tissue disease, hypertension, and dyslipidemia.  Reported 24 hours of worsening dyspnea, to the point where he became symptomatic with minimal efforts.  No cough, no fevers.  February 2022 had superficial thrombosis in left lower extremity.  On his initial physical examination oximetry was 90% on room air, blood pressure 145/95, heart rate 101, respiratory rate 24, lungs clear to auscultation, heart S1-S2, present, rhythmic, soft abdomen, right lower extremity edema.   Sodium 136, potassium 3.4, chloride 102, bicarb 24, glucose 108 BUN 13, creatinine 1.24, high sensitive troponin [phone].  White count 10.4, hemoglobin 14.6 hematocrit 45.0, platelets 209. D dimer > 20 SARS COVID 19 negative   Chest radiograph with prominent hilar vaculature   CT chest large pulmonary embolus burden affecting the distal aspects of both main pulmonary arteries.  Segmental branches in the bilateral upper lobes and bilateral lower lobes are involved.  RV/LV ratio 1.5 consistent with submassive PE.   EKG 10 bpm, left axis deviation, normal intervals, sinus rhythm, with poor R wave progression, no significant ST segment or T wave changes,.    Assessment & Plan:   Principal Problem:   Pulmonary emboli (HCC) Active Problems:   Essential hypertension   Rheumatoid arthritis ([place])   Dyslipidemia   Pulmonary embolism (HCC)   Acute bilateral pulmonary embolism, submassive per CT chest.  Echocardiogram with LV systolic function 60 to [id number], moderate LVH, hyperdynamic apex and hypokinetic free wall (McConnell's sign). Moderate reduction in RV systolic function. RSVP 19,5 mmHg. High troponin  consistent with RV strain.   Oxygenation is 98% on room air.  Blood pressure 118 to [id number] mmHg systolic. Patient has been ambulating with improved dyspnea but not yet back to baseline.  PESI index. Class 3 intermediate risk due to hypoxemia on admission.     Plan to continue IV heparin (parenteral anticoagulation), for today and change to oral anticoagulation in am. Continue blood pressure monitoring.    Patient had a provoked DVT and PE in 2005, after a right ankle fracture.    2. HTN. Continue to hold antihypertensive medications due to risk of hypotension. At home on HCTZ and valsartan.     3. RA.  Continue with methotrexate 15 mg q week.    4. Dyslipidemia. On rosuvastatin.     5. Hypokalemia/ CKD stage 2.  Renal function with serum cr at 1,33 with K at 4,1 and serum bicarbonate at 24 Plan to continue close follow up on renal function and electrolytes   6. Reactive leukocytosis. Wbc trending down to 12, continue to hold on antibiotic therapy, no clinical signs of infection     Status is: Inpatient  Remains inpatient appropriate because:Inpatient level of care appropriate due to severity of illness  Dispo: The patient is from: Home              Anticipated d/c is to: Home              Patient currently is not medically stable to d/c.   Difficult to place patient No    DVT prophylaxis: Heparin   Code Status:    full  Family Communication:   No family at the bedside      Subjective:  Patient feeling better, continue to have dyspnea with exertion but improved from admission, not yet back to baseline, no nausea or vomiting.  No chest pain.  Objective: Vitals:   07/28/21 0010 07/28/21 0528 07/28/21 0755 07/28/21 1447  BP: 127/81 122/77 113/84 118/72  Pulse: 89 95 97 (!) 102  Resp: '19 18 18 18  '$ Temp: 98.8 F (37.1 C) 98.5 F (36.9 C) 99.1 F (37.3 C) 99.1 F (37.3 C)  TempSrc: Oral Oral Oral Oral  SpO2: 98% 95% 99% 98%  Weight:      Height:        Intake/Output  Summary (Last 24 hours) at 07/28/2021 1550 Last data filed at 07/28/2021 0830 Gross per 24 hour  Intake 908.32 ml  Output 1425 ml  Net -516.68 ml   Filed Weights   07/26/21 1507 07/27/21 1946  Weight: 104.3 kg 104.6 kg    Examination:   General: Not in pain or dyspnea Neurology: Awake and alert, non focal  E ENT: no pallor, no icterus, oral mucosa moist Cardiovascular: No JVD. S1-S2 present, rhythmic, no gallops, rubs, or murmurs. Trace right lower extremity edema. Pulmonary: positive breath sounds bilaterally, with no wheezing, rhonchi or rales. Gastrointestinal. Abdomen soft and non tender Skin. No rashes Musculoskeletal: no joint deformities     Data Reviewed: I have personally reviewed following labs and imaging studies  CBC: Recent Labs  Lab 07/26/21 1026 07/27/21 1218 07/28/21 0222  WBC 10.4 14.0* 12.9*  NEUTROABS 6.8  --   --   HGB 14.6 15.4 14.3  HCT 45.0 45.7 43.3  MCV 91.5 89.4 89.3  PLT 209 216 [id number]   Basic Metabolic Panel: Recent Labs  Lab 07/26/21 1026 07/27/21 1218 07/28/21 0222  NA 136 136 136  K 3.4* 3.9 4.1  CL 102 104 104  CO2 '24 22 24  '$ GLUCOSE 108* 94 96  BUN '13 11 13  '$ CREATININE 1.24 1.17 1.33*  CALCIUM 9.4 9.3 9.2   GFR: Estimated Creatinine Clearance: 58.4 mL/min (A) (by C-G formula based on SCr of 1.33 mg/dL (H)). Liver Function Tests: Recent Labs  Lab 07/26/21 1026  AST 25  ALT 26  ALKPHOS 45  BILITOT 1.6*  PROT 7.5  ALBUMIN 3.9   No results for input(s): LIPASE, AMYLASE in the last 168 hours. No results for input(s): AMMONIA in the last 168 hours. Coagulation Profile: Recent Labs  Lab 07/26/21 1618  INR 1.2   Cardiac Enzymes: No results for input(s): CKTOTAL, CKMB, CKMBINDEX, TROPONINI in the last 168 hours. BNP (last 3 results) No results for input(s): PROBNP in the last 8760 hours. HbA1C: No results for input(s): HGBA1C in the last 72 hours. CBG: No results for input(s): GLUCAP in the last 168 hours. Lipid  Profile: No results for input(s): CHOL, HDL, LDLCALC, TRIG, CHOLHDL, LDLDIRECT in the last 72 hours. Thyroid Function Tests: No results for input(s): TSH, T4TOTAL, FREET4, T3FREE, THYROIDAB in the last 72 hours. Anemia Panel: No results for input(s): VITAMINB12, FOLATE, FERRITIN, TIBC, IRON, RETICCTPCT in the last 72 hours.    Radiology Studies: I have reviewed all of the imaging during this hospital visit personally     Scheduled Meds:  aspirin EC  81 mg Oral Daily   diclofenac Sodium  4 g Topical QID   folic acid  1 mg Oral Daily   rosuvastatin  10 mg Oral Daily   Continuous Infusions:  heparin 1,750 Units/hr (07/28/21 1120)     LOS: 2 days  Mayola Mcbain Gerome Apley, MD

## 2021-07-29 ENCOUNTER — Other Ambulatory Visit (HOSPITAL_COMMUNITY): Payer: Self-pay

## 2021-07-29 LAB — CBC
HCT: 43.1 % (ref 39.0–52.0)
Hemoglobin: 14.3 g/dL (ref 13.0–17.0)
MCH: 29.7 pg (ref 26.0–34.0)
MCHC: 33.2 g/dL (ref 30.0–36.0)
MCV: 89.4 fL (ref 80.0–100.0)
Platelets: 209 10*3/uL (ref 150–400)
RBC: 4.82 MIL/uL (ref 4.22–5.81)
RDW: 14.7 % (ref 11.5–15.5)
WBC: 12.1 10*3/uL — ABNORMAL HIGH (ref 4.0–10.5)
nRBC: 0 % (ref 0.0–0.2)

## 2021-07-29 LAB — BASIC METABOLIC PANEL
Anion gap: 8 (ref 5–15)
BUN: 14 mg/dL (ref 8–23)
CO2: 22 mmol/L (ref 22–32)
Calcium: 8.9 mg/dL (ref 8.9–10.3)
Chloride: 105 mmol/L (ref 98–111)
Creatinine, Ser: 1.25 mg/dL — ABNORMAL HIGH (ref 0.61–1.24)
GFR, Estimated: >60 mL/min (ref >60)
Glucose, Bld: 103 mg/dL — ABNORMAL HIGH (ref 70–99)
Potassium: 3.8 mmol/L (ref 3.5–5.1)
Sodium: 135 mmol/L (ref 135–145)

## 2021-07-29 MED ORDER — APIXABAN 5 MG PO TABS
10.0000 mg | ORAL_TABLET | Freq: Two times a day (BID) | ORAL | Status: DC
  Administered 2021-07-29: 10 mg via ORAL
  Filled 2021-07-29: qty 2

## 2021-07-29 MED ORDER — APIXABAN (ELIQUIS) VTE STARTER PACK (10MG AND 5MG)
ORAL_TABLET | ORAL | 0 refills | Status: DC
  Filled 2021-07-29: qty 74, 30d supply, fill #0

## 2021-07-29 MED ORDER — APIXABAN 5 MG PO TABS
5.0000 mg | ORAL_TABLET | Freq: Two times a day (BID) | ORAL | Status: DC
Start: 2021-08-05 — End: 2021-07-29

## 2021-07-29 NOTE — TOC Transition Note (Signed)
Transition of Care [facility]) - CM/SW Discharge Note   Patient Details  Name: Jerome Lee MRN: [id number] Date of Birth: December 24, 1950  [facility] of [facility]) CM/SW Contact:  Zenon Mayo, RN Phone Number: 07/29/2021, 12:28 PM   Clinical Narrative:    NCM spoke with patient, informed him of the copay amount for his eliquis of 47.00 for refills.  He is ok with this amount.  He states he will call his son to transport him home.  [facility] will bring his meds to his room before he discharges.   Final next level of care: Home/Self Care Barriers to Discharge: No Barriers Identified   Patient Goals and CMS Choice Patient states their goals for this hospitalization and ongoing recovery are:: return home      Discharge Placement                       Discharge Plan and Services                  DME Agency: NA       HH Arranged: NA          Social Determinants of Health (SDOH) Interventions     Readmission Risk Interventions No flowsheet data found.

## 2021-07-29 NOTE — Discharge Instructions (Addendum)
Jerome Lee,  You are in the hospital because of a blood clot in your lungs and your leg.  This was treated with blood thinners and you have improved.  You will need to follow-up with the lung doctor because your heart had a little bit of some enlargement of the blood clot.  You have been started on Eliquis for discharge and recommended to hold aspirin while on Eliquis.   Information on my medicine - ELIQUIS (apixaban)  Why was Eliquis prescribed for you? Eliquis was prescribed to treat blood clots that may have been found in the veins of your legs (deep vein thrombosis) or in your lungs (pulmonary embolism) and to reduce the risk of them occurring again.  What do You need to know about Eliquis ? The starting dose is 10 mg (two 5 mg tablets) taken TWICE daily for the FIRST SEVEN (7) DAYS, then the dose is reduced to ONE 5 mg tablet taken TWICE daily.  Eliquis may be taken with or without food.   Try to take the dose about the same time in the morning and in the evening. If you have difficulty swallowing the tablet whole please discuss with your pharmacist how to take the medication safely.  Take Eliquis exactly as prescribed and DO NOT stop taking Eliquis without talking to the doctor who prescribed the medication.  Stopping may increase your risk of developing a new blood clot.  Refill your prescription before you run out.  After discharge, you should have regular check-up appointments with your healthcare provider that is prescribing your Eliquis.    What do you do if you miss a dose? If a dose of ELIQUIS is not taken at the scheduled time, take it as soon as possible on the same day and twice-daily administration should be resumed. The dose should not be doubled to make up for a missed dose.  Important Safety Information A possible side effect of Eliquis is bleeding. You should call your healthcare provider right away if you experience any of the following: Bleeding from an  injury or your nose that does not stop. Unusual colored urine (red or dark brown) or unusual colored stools (red or black). Unusual bruising for unknown reasons. A serious fall or if you hit your head (even if there is no bleeding).  Some medicines may interact with Eliquis and might increase your risk of bleeding or clotting while on Eliquis. To help avoid this, consult your healthcare provider or pharmacist prior to using any new prescription or non-prescription medications, including herbals, vitamins, non-steroidal anti-inflammatory drugs (NSAIDs) and supplements.  This website has more information on Eliquis (apixaban): http://[email or web address].com/eliquis/home

## 2021-07-29 NOTE — Discharge Summary (Signed)
Physician Discharge Summary  DRAKEN MCKINEY [id number] DOB: 09/03/1951 DOA: 07/26/2021  PCP: Horald Pollen, MD  Admit date: 07/26/2021 Discharge date: 07/29/2021  Admitted From: Home Disposition: Home  Recommendations for Outpatient Follow-up:  Follow up with PCP in 1 week Please obtain BMP/CBC in one week Follow up with pulmonology Please follow up on the following pending results: None  Home Health: None Equipment/Devices: None  Discharge Condition: Stable CODE STATUS: Full code Diet recommendation: Regular diet   Brief/Interim Summary:  Admission HPI written by Tawni Millers, MD  HPI: Jerome Lee is a 70 y.o. male with medical history significant of RA on MTX, HTN, HLD, came with increasing SOB.   Symptoms started yesterday, progressively worse, this morning, only minimal activity triggered extreme shortness of breath.  Denies any cough, no chest pain, no fever or chills.  Patient has no recent long distance traveling or COVID infections. He did have a left leg swelling and pain in Feb 2022 and Doppler study showed negative DVT but positive for superficial thrombosis in left lower extremities which resolved without intervention.  He is a non-smoker.   Hospital course:  Acute submassive PE CTA chest on admission significant for a multiple PEs affecting distal aspects of both main pulmonary arteries. Patient started on Heparin IV. Transthoracic Echocardiogram significant for evidence of right heart strain. PCCM was consulted for heart strain and recommended no systemic or directed thrombolysis. Patient transitioned from Heparin IV to Eliquis on discharge.  Right heart strain RV dilation Secondary to submassive PE as mentioned above. Discussed with pulmonology who will follow-up with patient as an outpatient.  Primary hypertension Continue valsartan and hydrochlorothiazide  Rheumatoid arthritis Continue methotrexate  Hyperlipidemia Continue  Crestor  CKD stage 2 Stable.  Hypokalemia Given potassium.  Reactive leukocytosis Mild. No evidence of infection.  Discharge Diagnoses:  Principal Problem:   Pulmonary emboli [facility]) Active Problems:   Essential hypertension   Rheumatoid arthritis ([place])   Dyslipidemia   Pulmonary embolism ([place])    Discharge Instructions   Allergies as of 07/29/2021       Reactions   Penicillins Rash   Full body mottled rash   Amoxicillin Hives        Medication List     STOP taking these medications    aspirin 81 MG tablet       TAKE these medications    acetaminophen 500 MG tablet Commonly known as: TYLENOL Take 500 mg by mouth every 6 (six) hours as needed.   Apixaban Starter Pack ('10mg'$  and '5mg'$ ) Commonly known as: ELIQUIS STARTER PACK Take as directed on package: start with two-'5mg'$  tablets twice daily for 7 days. On day 8, switch to one-'5mg'$  tablet twice daily.   folic acid 1 MG tablet Commonly known as: FOLVITE Take 1 mg by mouth daily.   hydrochlorothiazide 12.5 MG tablet Commonly known as: HYDRODIURIL Take 1 tablet (12.5 mg total) by mouth daily.   methotrexate 2.5 MG tablet Commonly known as: RHEUMATREX Take 15 mg by mouth once a week.   MULTIVITAL PO Take 1 tablet by mouth daily.   rosuvastatin 10 MG tablet Commonly known as: Crestor Take 1 tablet (10 mg total) by mouth daily.   valsartan 80 MG tablet Commonly known as: Diovan Take 1 tablet (80 mg total) by mouth daily.       ASK your doctor about these medications    diclofenac Sodium 1 % Gel Commonly known as: VOLTAREN Apply 4 g topically 4 (four)  times daily.        Follow-up Information     Hunsucker, Bonna Gains, MD. Go on 08/25/2021.   Specialty: Pulmonary Disease Why: 330 pm Contact information: [address] [place] [address] [phone]         Horald Pollen, MD Follow up in 1 week(s).   Specialty: Internal Medicine Why: For hospital  follow-up Contact information: [place] [address] [phone]                Allergies  Allergen Reactions   Penicillins Rash    Full body mottled rash   Amoxicillin Hives    Consultations: PCCM   Procedures/Studies: DG Chest 2 View  Result Date: 07/26/2021 CLINICAL DATA:  Shortness of breath. EXAM: CHEST - 2 VIEW COMPARISON:  November 27, 2016 FINDINGS: The heart size and mediastinal contours are within normal limits. Both lungs are clear. The visualized skeletal structures are unremarkable. IMPRESSION: No active cardiopulmonary disease. Electronically Signed   By: Dorise Bullion III M.D.   On: 07/26/2021 11:06   CT Angio Chest PE W/Cm &/Or Wo Cm  Result Date: 07/26/2021 CLINICAL DATA:  High probability for pulmonary embolus. Shortness of breath. EXAM: CT ANGIOGRAPHY CHEST WITH CONTRAST TECHNIQUE: Multidetector CT imaging of the chest was performed using the standard protocol during bolus administration of intravenous contrast. Multiplanar CT image reconstructions and MIPs were obtained to evaluate the vascular anatomy. CONTRAST:  18m OMNIPAQUE IOHEXOL 350 MG/ML SOLN COMPARISON:  Chest CT November 28, 2016. Chest x-ray July 26, 2021. FINDINGS: Cardiovascular: The thoracic aorta is nonaneurysmal without identified dissection. Minimal atherosclerotic change noted. The heart size is borderline to mildly enlarged. Bilateral pulmonary emboli are identified. On the left, embolus extends from the distal main pulmonary artery into both upper and lower lobe branches. At least 3 upper lobe segments on the left and 4 lower lobe segments on the left are involved. Embolus extends from the distal main right pulmonary artery in the proximal upper lobe branches and multiple lower lobe segmental branches. The main pulmonary artery is normal in caliber. The right ventricular diameter is 5 cm and the left ventricular diameter is 3.4 cm with an RV/LV ratio of 1.5. Mediastinum/Nodes:  No enlarged mediastinal, hilar, or axillary lymph nodes. Thyroid gland, trachea, and esophagus demonstrate no significant findings. Lungs/Pleura: Central airways are normal. No pneumothorax. Scattered subsegmental atelectasis. No suspicious infiltrates. No suspicious nodules or masses. Upper Abdomen: No acute abnormality. Musculoskeletal: No chest wall abnormality. No acute or significant osseous findings. Review of the MIP images confirms the above findings. IMPRESSION: 1. There is a large pulmonary embolus burden affecting the distal aspects of both main pulmonary arteries. Segmental branches in the bilateral upper lobes and bilateral lower lobes are involved. The RV/LV ratio is 1.5. Positive for acute PE with CT evidence of right heart strain (RV/LV Ratio = 1.5) consistent with at least submassive (intermediate risk) PE. The presence of right heart strain has been associated with an increased risk of morbidity and mortality. Please refer to the "PE Focused" order set in EPIC. 2. Minimal atherosclerotic change in the nonaneurysmal thoracic aorta. 3. No other acute abnormalities. Findings called to the patient's PA, Margaux Venter. Aortic Atherosclerosis (ICD10-I70.0). Electronically Signed   By: DDorise BullionIII M.D.   On: 07/26/2021 14:23   ECHOCARDIOGRAM COMPLETE  Result Date: 07/26/2021    ECHOCARDIOGRAM REPORT   Patient Name:   JKALINO BOHNERDate of Exam: 07/26/2021 Medical Rec #:  [id number]       Height:       65.0 in Accession #:    [id number]      Weight:       230.0 lb Date of Birth:  1951-07-22       BSA:          2.099 m Patient Age:    70 years        BP:           154/105 mmHg Patient Gender: M               HR:           109 bpm. Exam Location:  Inpatient Procedure: 2D Echo, Cardiac Doppler, Color Doppler and Intracardiac            Opacification Agent STAT ECHO Indications:    Pulmonary embolism  History:        Patient has no prior history of Echocardiogram examinations.                  Signs/Symptoms:Shortness of Breath. DVT. PE.  Sonographer:    Dustin Flock RDCS Referring Phys: [id number] Julian Hy  Sonographer Comments: Patient is morbidly obese. IMPRESSIONS  1. Left ventricular ejection fraction, by estimation, is 60 to 65%. The left ventricle has normal function. The left ventricle has no regional wall motion abnormalities. There is moderate left ventricular hypertrophy. Left ventricular diastolic parameters are indeterminate.  2. Hyperdynamic apex with hypokinetic free wall (McConnell's sign) - could be suggestive of RV strain d/t hemodynamically significant pulmonary embolus. Right ventricular systolic function is moderately reduced. The right ventricular size is mildly enlarged. There is normal pulmonary artery systolic pressure. The estimated right ventricular systolic pressure is 123456 mmHg.  3. The mitral valve is grossly normal. No evidence of mitral valve regurgitation.  4. The aortic valve is tricuspid. Aortic valve regurgitation is not visualized.  5. The inferior vena cava is normal in size with greater than 50% respiratory variability, suggesting right atrial pressure of 3 mmHg. Comparison(s): No prior Echocardiogram. Conclusion(s)/Recommendation(s): Critical findings reported to Dr. Noemi Chapel and acknowledged at 3:46 pm on 07/26/2021. FINDINGS  Left Ventricle: Left ventricular ejection fraction, by estimation, is 60 to 65%. The left ventricle has normal function. The left ventricle has no regional wall motion abnormalities. Definity contrast agent was given IV to delineate the left ventricular  endocardial borders. The left ventricular internal cavity size was normal in size. There is moderate left ventricular hypertrophy. Left ventricular diastolic parameters are indeterminate. Right Ventricle: Hyperdynamic apex with hypokinetic free wall (McConnell's sign) - could be suggestive of RV strain d/t hemodynamically significant pulmonary embolus. The right ventricular size is  mildly enlarged. No increase in right ventricular wall thickness. Right ventricular systolic function is moderately reduced. There is normal pulmonary artery systolic pressure. The tricuspid regurgitant velocity is 2.03 m/s, and with an assumed right atrial pressure of 3 mmHg, the estimated right ventricular  systolic pressure is 123456 mmHg. Left Atrium: Left atrial size was normal in size. Right Atrium: Right atrial size was normal in size. Pericardium: There is no evidence of pericardial effusion. Mitral Valve: The mitral valve is grossly normal. No evidence of mitral valve regurgitation. Tricuspid Valve: The tricuspid valve is grossly normal. Tricuspid valve regurgitation is trivial. Aortic Valve: The aortic valve is tricuspid. Aortic valve regurgitation is not visualized. Pulmonic Valve: The pulmonic valve was grossly normal. Pulmonic valve regurgitation is trivial. Aorta: The aortic root and ascending aorta  are structurally normal, with no evidence of dilitation. Venous: The inferior vena cava is normal in size with greater than 50% respiratory variability, suggesting right atrial pressure of 3 mmHg. IAS/Shunts: No atrial level shunt detected by color flow Doppler.  LEFT VENTRICLE PLAX 2D LVIDd:         4.20 cm LVIDs:         3.00 cm LV PW:         1.30 cm LV IVS:        1.50 cm LVOT diam:     2.30 cm LV SV:         43 LV SV Index:   20 LVOT Area:     4.15 cm  RIGHT VENTRICLE RV Basal diam:  3.80 cm RV S prime:     7.94 cm/s TAPSE (M-mode): 1.4 cm LEFT ATRIUM             Index       RIGHT ATRIUM           Index LA diam:        3.20 cm 1.52 cm/m  RA Area:     15.60 cm LA Vol (A2C):   45.5 ml 21.68 ml/m RA Volume:   43.40 ml  20.67 ml/m LA Vol (A4C):   36.3 ml 17.29 ml/m LA Biplane Vol: 41.8 ml 19.91 ml/m  AORTIC VALVE LVOT Vmax:   72.20 cm/s LVOT Vmean:  40.800 cm/s LVOT VTI:    0.103 m  AORTA Ao Root diam: 3.30 cm TRICUSPID VALVE TR Peak grad:   16.5 mmHg TR Vmax:        203.00 cm/s  SHUNTS Systemic VTI:   0.10 m Systemic Diam: 2.30 cm Lyman Bishop MD Electronically signed by Lyman Bishop MD Signature Date/Time: 07/26/2021/3:58:05 PM    Final    VAS [place] LOWER EXTREMITY VENOUS (DVT)  Result Date: 07/28/2021  Lower Venous DVT Study Patient Name:  JARAE KLEINTOP  Date of Exam:   07/27/2021 Medical Rec #: [id number]        Accession #:    [id number] Date of Birth: 12-05-1951        Patient Gender: M Patient Age:   75 years Exam Location:  [facility] Procedure:      VAS [place] LOWER EXTREMITY VENOUS (DVT) Referring Phys: Gwyndolyn Saxon MINOR --------------------------------------------------------------------------------  Indications: PE.  Risk Factors: HX of PE - 2001 & 2017 per CT results seen in chart. Limitations: Body habitus and poor ultrasound/tissue interface. Comparison Study: Previous exam 2/22/022 - negative DVT positive SVT LLE GSV Performing Technologist: Jody Hill RVT, RDMS  Examination Guidelines: A complete evaluation includes B-mode imaging, spectral Doppler, color Doppler, and power Doppler as needed of all accessible portions of each vessel. Bilateral testing is considered an integral part of a complete examination. Limited examinations for reoccurring indications may be performed as noted. The reflux portion of the exam is performed with the patient in reverse Trendelenburg.  +---------+---------------+---------+-----------+----------+-------------------+ RIGHT    CompressibilityPhasicitySpontaneityPropertiesThrombus Aging      +---------+---------------+---------+-----------+----------+-------------------+ CFV      Full           Yes      Yes                                      +---------+---------------+---------+-----------+----------+-------------------+ SFJ      Full                                                             +---------+---------------+---------+-----------+----------+-------------------+  FV Prox  Full           Yes      Yes                                       +---------+---------------+---------+-----------+----------+-------------------+ FV Mid   Full           Yes      Yes                                      +---------+---------------+---------+-----------+----------+-------------------+ FV DistalFull           Yes      Yes                                      +---------+---------------+---------+-----------+----------+-------------------+ PFV      Full                                                             +---------+---------------+---------+-----------+----------+-------------------+ POP      Full           Yes      Yes                                      +---------+---------------+---------+-----------+----------+-------------------+ PTV      Full                                         Not well visualized +---------+---------------+---------+-----------+----------+-------------------+ PERO     Full                                         Not well visualized +---------+---------------+---------+-----------+----------+-------------------+   +---------+---------------+---------+-----------+----------+-------------------+ LEFT     CompressibilityPhasicitySpontaneityPropertiesThrombus Aging      +---------+---------------+---------+-----------+----------+-------------------+ CFV      Full           Yes      Yes                                      +---------+---------------+---------+-----------+----------+-------------------+ SFJ      Full                                                             +---------+---------------+---------+-----------+----------+-------------------+ FV Prox  Full           Yes      Yes                                      +---------+---------------+---------+-----------+----------+-------------------+  FV Mid   Full           Yes      Yes                                       +---------+---------------+---------+-----------+----------+-------------------+ FV DistalFull           Yes      Yes                                      +---------+---------------+---------+-----------+----------+-------------------+ PFV      Full                                                             +---------+---------------+---------+-----------+----------+-------------------+ POP      None           No       No                   Acute               +---------+---------------+---------+-----------+----------+-------------------+ PTV      Full                                         Not well visualized +---------+---------------+---------+-----------+----------+-------------------+ PERO     None           No       No                   Acute               +---------+---------------+---------+-----------+----------+-------------------+     Summary: BILATERAL: - No evidence of superficial venous thrombosis in the lower extremities, bilaterally. -No evidence of popliteal cyst, bilaterally. RIGHT: - There is no evidence of deep vein thrombosis in the lower extremity.  LEFT: - Findings consistent with acute deep vein thrombosis involving the left popliteal vein, and left peroneal veins.  *See table(s) above for measurements and observations. Electronically signed by Deitra Mayo MD on 07/28/2021 at 7:31:51 AM.    Final     TRANSTHORACIC ECHOCARDIOGRAM (07/26/2021) IMPRESSIONS     1. Left ventricular ejection fraction, by estimation, is 60 to 65%. The  left ventricle has normal function. The left ventricle has no regional  wall motion abnormalities. There is moderate left ventricular hypertrophy.  Left ventricular diastolic  parameters are indeterminate.   2. Hyperdynamic apex with hypokinetic free wall (McConnell's sign) -  could be suggestive of RV strain d/t hemodynamically significant pulmonary  embolus. Right ventricular systolic function is moderately  reduced. The  right ventricular size is mildly  enlarged. There is normal pulmonary artery systolic pressure. The  estimated right ventricular systolic pressure is 123456 mmHg.   3. The mitral valve is grossly normal. No evidence of mitral valve  regurgitation.   4. The aortic valve is tricuspid. Aortic valve regurgitation is not  visualized.   5. The inferior vena cava is normal in size with greater than 50%  respiratory variability, suggesting right atrial pressure  of 3 mmHg.   Subjective: No issues overnight  Discharge Exam: Vitals:   07/29/21 0526 07/29/21 1158  BP: 110/70 123/76  Pulse: 89 88  Resp: 19 17  Temp: 98.4 F (36.9 C) 98.3 F (36.8 C)  SpO2: 96%    Vitals:   07/28/21 1447 07/28/21 2035 07/29/21 0526 07/29/21 1158  BP: 118/72 130/78 110/70 123/76  Pulse: (!) 102 (!) 104 89 88  Resp: '18 18 19 17  '$ Temp: 99.1 F (37.3 C) 98.7 F (37.1 C) 98.4 F (36.9 C) 98.3 F (36.8 C)  TempSrc: Oral Oral Oral Oral  SpO2: 98% 98% 96%   Weight:      Height:        General: Pt is alert, awake, not in acute distress Cardiovascular: RRR, S1/S2 +, no rubs, no gallops Respiratory: CTA bilaterally, no wheezing, no rhonchi Abdominal: Soft, NT, ND, bowel sounds + Extremities: no edema, no cyanosis    The results of significant diagnostics from this hospitalization (including imaging, microbiology, ancillary and laboratory) are listed below for reference.     Microbiology: Recent Results (from the past 240 hour(s))  Resp Panel by RT-PCR (Flu A&B, Covid) Nasopharyngeal Swab     Status: None   Collection Time: 07/26/21  3:12 PM   Specimen: Nasopharyngeal Swab; Nasopharyngeal(NP) swabs in vial transport medium  Result Value Ref Range Status   SARS Coronavirus 2 by RT PCR NEGATIVE NEGATIVE Final    Comment: (NOTE) SARS-CoV-2 target nucleic acids are NOT DETECTED.  The SARS-CoV-2 RNA is generally detectable in upper respiratory specimens during the acute phase of  infection. The lowest concentration of SARS-CoV-2 viral copies this assay can detect is 138 copies/mL. A negative result does not preclude SARS-Cov-2 infection and should not be used as the sole basis for treatment or other patient management decisions. A negative result may occur with  improper specimen collection/handling, submission of specimen other than nasopharyngeal swab, presence of viral mutation(s) within the areas targeted by this assay, and inadequate number of viral copies(<138 copies/mL). A negative result must be combined with clinical observations, patient history, and epidemiological information. The expected result is Negative.  Fact Sheet for Patients:  EntrepreneurPulse.com.au  Fact Sheet for Healthcare Providers:  IncredibleEmployment.be  This test is no t yet approved or cleared by the [place] FDA and  has been authorized for detection and/or diagnosis of SARS-CoV-2 by FDA under an Emergency Use Authorization (EUA). This EUA will remain  in effect (meaning this test can be used) for the duration of the COVID-19 declaration under Section 564(b)(1) of the Act, 21 U.S.C.section 360bbb-3(b)(1), unless the authorization is terminated  or revoked sooner.       Influenza A by PCR NEGATIVE NEGATIVE Final   Influenza B by PCR NEGATIVE NEGATIVE Final    Comment: (NOTE) The Xpert Xpress SARS-CoV-2/FLU/RSV plus assay is intended as an aid in the diagnosis of influenza from Nasopharyngeal swab specimens and should not be used as a sole basis for treatment. Nasal washings and aspirates are unacceptable for Xpert Xpress SARS-CoV-2/FLU/RSV testing.  Fact Sheet for Patients: EntrepreneurPulse.com.au  Fact Sheet for Healthcare Providers: IncredibleEmployment.be  This test is not yet approved or cleared by the [place] FDA and has been authorized for detection and/or diagnosis of SARS-CoV-2  by FDA under an Emergency Use Authorization (EUA). This EUA will remain in effect (meaning this test can be used) for the duration of the COVID-19 declaration under Section 564(b)(1) of the Act, 21 U.S.C. section 360bbb-3(b)(1), unless the  authorization is terminated or revoked.  Performed at [facility], [place] [address], [place], [place] [address]      Labs: BNP (last 3 results) Recent Labs    07/26/21 1026  BNP 9.8   Basic Metabolic Panel: Recent Labs  Lab 07/26/21 1026 07/27/21 1218 07/28/21 0222 07/29/21 0215  NA 136 136 136 135  K 3.4* 3.9 4.1 3.8  CL 102 104 104 105  CO2 '24 22 24 22  '$ GLUCOSE 108* 94 96 103*  BUN '13 11 13 14  '$ CREATININE 1.24 1.17 1.33* 1.25*  CALCIUM 9.4 9.3 9.2 8.9   Liver Function Tests: Recent Labs  Lab 07/26/21 1026  AST 25  ALT 26  ALKPHOS 45  BILITOT 1.6*  PROT 7.5  ALBUMIN 3.9   No results for input(s): LIPASE, AMYLASE in the last 168 hours. No results for input(s): AMMONIA in the last 168 hours. CBC: Recent Labs  Lab 07/26/21 1026 07/27/21 1218 07/28/21 0222 07/29/21 0215  WBC 10.4 14.0* 12.9* 12.1*  NEUTROABS 6.8  --   --   --   HGB 14.6 15.4 14.3 14.3  HCT 45.0 45.7 43.3 43.1  MCV 91.5 89.4 89.3 89.4  PLT 209 216 203 209   Cardiac Enzymes: No results for input(s): CKTOTAL, CKMB, CKMBINDEX, TROPONINI in the last 168 hours. BNP: Invalid input(s): POCBNP CBG: No results for input(s): GLUCAP in the last 168 hours. D-Dimer No results for input(s): DDIMER in the last 72 hours. Hgb A1c No results for input(s): HGBA1C in the last 72 hours. Lipid Profile No results for input(s): CHOL, HDL, LDLCALC, TRIG, CHOLHDL, LDLDIRECT in the last 72 hours. Thyroid function studies No results for input(s): TSH, T4TOTAL, T3FREE, THYROIDAB in the last 72 hours.  Invalid input(s): FREET3 Anemia work up No results for input(s): VITAMINB12, FOLATE, FERRITIN, TIBC, IRON, RETICCTPCT in the last 72 hours. Urinalysis No results  found for: COLORURINE, APPEARANCEUR, [place], [place], GLUCOSEU, [place], [place], [place], PROTEINUR, UROBILINOGEN, NITRITE, LEUKOCYTESUR Sepsis Labs Invalid input(s): PROCALCITONIN,  WBC,  LACTICIDVEN Microbiology Recent Results (from the past 240 hour(s))  Resp Panel by RT-PCR (Flu A&B, Covid) Nasopharyngeal Swab     Status: None   Collection Time: 07/26/21  3:12 PM   Specimen: Nasopharyngeal Swab; Nasopharyngeal(NP) swabs in vial transport medium  Result Value Ref Range Status   SARS Coronavirus 2 by RT PCR NEGATIVE NEGATIVE Final    Comment: (NOTE) SARS-CoV-2 target nucleic acids are NOT DETECTED.  The SARS-CoV-2 RNA is generally detectable in upper respiratory specimens during the acute phase of infection. The lowest concentration of SARS-CoV-2 viral copies this assay can detect is 138 copies/mL. A negative result does not preclude SARS-Cov-2 infection and should not be used as the sole basis for treatment or other patient management decisions. A negative result may occur with  improper specimen collection/handling, submission of specimen other than nasopharyngeal swab, presence of viral mutation(s) within the areas targeted by this assay, and inadequate number of viral copies(<138 copies/mL). A negative result must be combined with clinical observations, patient history, and epidemiological information. The expected result is Negative.  Fact Sheet for Patients:  EntrepreneurPulse.com.au  Fact Sheet for Healthcare Providers:  IncredibleEmployment.be  This test is no t yet approved or cleared by the [place] FDA and  has been authorized for detection and/or diagnosis of SARS-CoV-2 by FDA under an Emergency Use Authorization (EUA). This EUA will remain  in effect (meaning this test can be used) for the duration of the COVID-19 declaration under Section  564(b)(1) of the Act, 21 U.S.C.section 360bbb-3(b)(1), unless the authorization  is terminated  or revoked sooner.       Influenza A by PCR NEGATIVE NEGATIVE Final   Influenza B by PCR NEGATIVE NEGATIVE Final    Comment: (NOTE) The Xpert Xpress SARS-CoV-2/FLU/RSV plus assay is intended as an aid in the diagnosis of influenza from Nasopharyngeal swab specimens and should not be used as a sole basis for treatment. Nasal washings and aspirates are unacceptable for Xpert Xpress SARS-CoV-2/FLU/RSV testing.  Fact Sheet for Patients: EntrepreneurPulse.com.au  Fact Sheet for Healthcare Providers: IncredibleEmployment.be  This test is not yet approved or cleared by the [place] FDA and has been authorized for detection and/or diagnosis of SARS-CoV-2 by FDA under an Emergency Use Authorization (EUA). This EUA will remain in effect (meaning this test can be used) for the duration of the COVID-19 declaration under Section 564(b)(1) of the Act, 21 U.S.C. section 360bbb-3(b)(1), unless the authorization is terminated or revoked.  Performed at [facility], [place] [address] [place], [place] [address]      Time coordinating discharge: 35 minutes  SIGNED:   Cordelia Poche, MD [facility] 07/29/2021, 12:12 PM
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
## 2021-07-29 NOTE — Plan of Care (Signed)

## 2021-07-30 ENCOUNTER — Other Ambulatory Visit (HOSPITAL_COMMUNITY): Payer: Self-pay

## 2021-08-04 ENCOUNTER — Other Ambulatory Visit (HOSPITAL_COMMUNITY): Payer: Self-pay

## 2021-08-04 ENCOUNTER — Telehealth (HOSPITAL_COMMUNITY): Payer: Self-pay

## 2021-08-04 ENCOUNTER — Other Ambulatory Visit: Payer: Self-pay

## 2021-08-04 ENCOUNTER — Encounter: Payer: Self-pay | Admitting: Emergency Medicine

## 2021-08-04 ENCOUNTER — Ambulatory Visit (INDEPENDENT_AMBULATORY_CARE_PROVIDER_SITE_OTHER): Payer: Medicare Other | Admitting: Emergency Medicine

## 2021-08-04 VITALS — BP 132/82 | HR 88 | Temp 98.2°F | Ht 65.0 in | Wt 230.0 lb

## 2021-08-04 DIAGNOSIS — Z7901 Long term (current) use of anticoagulants: Secondary | ICD-10-CM

## 2021-08-04 DIAGNOSIS — E785 Hyperlipidemia, unspecified: Secondary | ICD-10-CM | POA: Diagnosis not present

## 2021-08-04 DIAGNOSIS — Z09 Encounter for follow-up examination after completed treatment for conditions other than malignant neoplasm: Secondary | ICD-10-CM

## 2021-08-04 DIAGNOSIS — I2609 Other pulmonary embolism with acute cor pulmonale: Secondary | ICD-10-CM | POA: Diagnosis not present

## 2021-08-04 DIAGNOSIS — M069 Rheumatoid arthritis, unspecified: Secondary | ICD-10-CM

## 2021-08-04 DIAGNOSIS — I10 Essential (primary) hypertension: Secondary | ICD-10-CM

## 2021-08-04 LAB — COMPREHENSIVE METABOLIC PANEL
ALT: 30 U/L (ref 0–53)
AST: 25 U/L (ref 0–37)
Albumin: 4.1 g/dL (ref 3.5–5.2)
Alkaline Phosphatase: 42 U/L (ref 39–117)
BUN: 14 mg/dL (ref 6–23)
CO2: 28 mEq/L (ref 19–32)
Calcium: 10 mg/dL (ref 8.4–10.5)
Chloride: 100 mEq/L (ref 96–112)
Creatinine, Ser: 1.26 mg/dL (ref 0.40–1.50)
GFR: 58.08 mL/min — ABNORMAL LOW (ref >60.00)
Glucose, Bld: 79 mg/dL (ref 70–99)
Potassium: 4 mEq/L (ref 3.5–5.1)
Sodium: 135 mEq/L (ref 135–145)
Total Bilirubin: 0.7 mg/dL (ref 0.2–1.2)
Total Protein: 8 g/dL (ref 6.0–8.3)

## 2021-08-04 LAB — CBC WITH DIFFERENTIAL/PLATELET
Basophils Absolute: 0.1 10*3/uL (ref 0.0–0.1)
Basophils Relative: 1 % (ref 0.0–3.0)
Eosinophils Absolute: 0.1 10*3/uL (ref 0.0–0.7)
Eosinophils Relative: 1.6 % (ref 0.0–5.0)
HCT: 42.8 % (ref 39.0–52.0)
Hemoglobin: 13.9 g/dL (ref 13.0–17.0)
Lymphocytes Relative: 19.8 % (ref 12.0–46.0)
Lymphs Abs: 1.7 10*3/uL (ref 0.7–4.0)
MCHC: 32.6 g/dL (ref 30.0–36.0)
MCV: 90.7 fl (ref 78.0–100.0)
Monocytes Absolute: 0.8 10*3/uL (ref 0.1–1.0)
Monocytes Relative: 9.1 % (ref 3.0–12.0)
Neutro Abs: 6 10*3/uL (ref 1.4–7.7)
Neutrophils Relative %: 68.5 % (ref 43.0–77.0)
Platelets: 339 10*3/uL (ref 150.0–400.0)
RBC: 4.72 Mil/uL (ref 4.22–5.81)
RDW: 15.5 % (ref 11.5–15.5)
WBC: 8.7 10*3/uL (ref 4.0–10.5)

## 2021-08-04 NOTE — Patient Instructions (Signed)
Pulmonary Embolism  A pulmonary embolism (PE) is a sudden blockage or decrease of blood flow in one or both lungs that happens when a clot travels into the arteries of the lung (pulmonary arteries). Most blockages come from a blood clot that forms in the vein of a leg or arm (deep vein thrombosis, DVT) and travels to the lungs. A clot is blood that has thickened into a gel or solid. PE is a dangerous and life-threatening condition that needs to betreated right away. What are the causes? This condition is usually caused by a blood clot that forms in a vein and moves to the lungs. In rare cases, it may be caused by air, fat, part of a tumor, orother tissue that moves through the veins and into the lungs. What increases the risk? The following factors may make you more likely to develop this condition: Experiencing a traumatic injury, such as breaking a hip or leg. Having: A spinal cord injury. Major surgery, especially hip or knee replacement, or surgery on parts of the nervous system or on the abdomen. A stroke. A blood-clotting disease. Long-term (chronic) lung or heart disease. Cancer, especially if you are being treated with chemotherapy. A central venous catheter. Taking medicines that contain estrogen. These include birth control pills and hormone replacement therapy. Being: Pregnant. In the period of time after your baby is delivered (postpartum). Older than age 71. Overweight. A smoker, especially if you have other risks. Not very active (sedentary), not being able to move at all, or spending long periods sitting, such as travel over 6 hours. You are also at a greater risk if you have a leg in a cast or splint. What are the signs or symptoms? Symptoms of this condition usually start suddenly and include: Shortness of breath during activity or at rest. Coughing, coughing up blood, or coughing up bloody mucus. Chest pain, back pain, or shoulder blade pain that gets worse with deep  breaths. Rapid or irregular heartbeat. Feeling light-headed or dizzy, or fainting. Feeling anxious. Pain and swelling in a leg. This is a symptom of DVT, which can lead to PE. How is this diagnosed? This condition may be diagnosed based on your medical history, a physical exam, and tests. Tests may include: Blood tests. An ECG (electrocardiogram) of the heart. A CT pulmonary angiogram. This test checks blood flow in and around your lungs. A ventilation-perfusion scan, also called a lung VQ scan. This test measures air flow and blood flow to the lungs. An ultrasound to check for a DVT. How is this treated? Treatment for this condition depends on many factors, such as the cause of your PE, your risk for bleeding or developing more clots, and other medical conditions you may have. Treatment aims to stop blood clots from forming or growing larger. In some cases, treatment may be aimed at breaking apart or removing the blood clot. Treatment may include: Medicines, such as: Blood thinning medicines, also called anticoagulants, to stop clots from forming and growing. Medicines that break apart clots (fibrinolytics). Procedures, such as: Using a flexible tube to remove a blood clot (embolectomy) or to deliver medicine to destroy it (catheter-directed thrombolysis). Surgery to remove the clot (surgical embolectomy). This is rare. You may need a combination of immediate, long-term, and extended treatments. Your treatment may continue for several months (maintenance therapy) or longer depending on your medical conditions. You and your health care provider will work together to choose the treatment program that is best foryou. Follow these instructions  at home: Medicines Take over-the-counter and prescription medicines only as told by your health care provider. If you are taking blood thinners: Talk with your health care provider before you take any medicines that contain aspirin or NSAIDs, such as  ibuprofen. These medicines increase your risk for dangerous bleeding. Take your medicine exactly as told, at the same time every day. Avoid activities that could cause injury or bruising, and follow instructions about how to prevent falls. Wear a medical alert bracelet or carry a card that lists what medicines you take. Understand what foods and drugs interact with any medicines that you are taking. General instructions Ask your health care provider when you may return to your normal activities. Avoid sitting or lying for a long time without moving. Maintain a healthy weight. Ask your health care provider what weight is healthy for you. Do not use any products that contain nicotine or tobacco. These products include cigarettes, chewing tobacco, and vaping devices, such as e-cigarettes. If you need help quitting, ask your health care provider. Talk with your health care provider about any travel plans. It is important to make sure that you are still able to take your medicine while traveling. Keep all follow-up visits. This is important. Where to find more information American Lung Association: www.lung.org Centers for Disease Control and Prevention: http://www.wolf.info/ Contact a health care provider if: You missed a dose of your blood thinner medicine. You have a fever. Get help right away if: You have: New or increased pain, swelling, warmth, or redness in an arm or leg. Shortness of breath that gets worse during activity or at rest. Worsening chest pain. A rapid or irregular heartbeat. A severe headache. Vision changes. A serious fall or accident, or you hit your head. Blood in your vomit, stool, or urine. A cut that will not stop bleeding. You cough up blood. You feel light-headed or dizzy, and that feeling does not go away. You cannot move your arms or legs. You are confused or have memory loss. These symptoms may represent a serious problem that is an emergency. Do not wait to see if the  symptoms will go away. Get medical help right away. Call your local emergency services (911 in the [place] Do not drive yourself to the hospital. Summary A pulmonary embolism (PE) is a serious and potentially life-threatening condition. It happens when a blood clot from one part of the body travels to the arteries of the lung, causing a sudden blockage or decrease of blood flow to the lungs. This may result in shortness of breath, chest pain, dizziness, and fainting. Treatments for this condition usually include medicines to thin your blood (anticoagulants) or medicines to break apart blood clots. If you are given blood thinners, take your medicine exactly as told by your health care provider, at the same time every day. This is important. Understand what foods and drugs interact with any medicines that you are taking. If you have signs of PE or DVT, call your local emergency services (911 in the [place] This information is not intended to replace advice given to you by your health care provider. Make sure you discuss any questions you have with your healthcare provider. Document Revised: 10/31/2020 Document Reviewed: 10/31/2020 Elsevier Patient Education  2022 [employer].

## 2021-08-04 NOTE — Assessment & Plan Note (Signed)
Fall precautions discussed. 

## 2021-08-04 NOTE — Assessment & Plan Note (Signed)
Well-controlled hypertension continue hydrochlorothiazide and valsartan 80 mg daily.
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
## 2021-08-04 NOTE — Telephone Encounter (Signed)
LVM

## 2021-08-04 NOTE — Telephone Encounter (Signed)
Patient states he is improving. He went to his follow up appt today with his primary care doctor. He is experiencing increased urination. He states HCTZ is a new medication for him, and I advised him that was the cause. I advised him to take it in the morning and maintain hydration and his urination frequency should improve as his body adjusts to the new medication. He inquired about refills on his Eliquis. I do not see that his primary care doctor refilled it yet. I advised him he could call back and request his primary care to refill or the pulmonology doctor at his appt on 08/25/21. He is not experiencing any side effects from the Eliquis.

## 2021-08-04 NOTE — Assessment & Plan Note (Signed)
Diet and nutrition discussed continue rosuvastatin 10 mg daily.

## 2021-08-04 NOTE — Assessment & Plan Note (Addendum)
Stable.  Continue methotrexate weekly.

## 2021-08-04 NOTE — Assessment & Plan Note (Signed)
Stable.  Presently on Eliquis daily and probably for lifetime.  Follow-up with pulmonary as scheduled next month.

## 2021-08-25 ENCOUNTER — Encounter: Payer: Self-pay | Admitting: Pulmonary Disease

## 2021-08-25 ENCOUNTER — Other Ambulatory Visit: Payer: Self-pay

## 2021-08-25 ENCOUNTER — Ambulatory Visit (INDEPENDENT_AMBULATORY_CARE_PROVIDER_SITE_OTHER): Payer: Medicare Other | Admitting: Pulmonary Disease

## 2021-08-25 VITALS — BP 126/64 | HR 70 | Temp 98.7°F | Ht 65.0 in | Wt 229.6 lb

## 2021-08-25 DIAGNOSIS — I2609 Other pulmonary embolism with acute cor pulmonale: Secondary | ICD-10-CM | POA: Diagnosis not present

## 2021-08-25 MED ORDER — APIXABAN 5 MG PO TABS: 5.0000 mg | ORAL_TABLET | Freq: Two times a day (BID) | ORAL | 11 refills | Status: DC

## 2021-08-25 NOTE — Patient Instructions (Addendum)
Nice to see you  Continue Eliquis 5 mg twice a day. I sent in refills today.  We will repeat a heart ultrasound not make sure the pressure on the heart has improved.  I will see you in clinic to discuss results in the days after that  Return to clinic in 2 months or sooner as needed
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
## 2021-09-17 ENCOUNTER — Other Ambulatory Visit (HOSPITAL_COMMUNITY): Payer: Medicare Other

## 2021-10-03 ENCOUNTER — Other Ambulatory Visit: Payer: Self-pay

## 2021-10-03 ENCOUNTER — Ambulatory Visit (INDEPENDENT_AMBULATORY_CARE_PROVIDER_SITE_OTHER): Payer: Medicare Other

## 2021-10-03 DIAGNOSIS — Z23 Encounter for immunization: Secondary | ICD-10-CM

## 2021-10-12 DIAGNOSIS — R768 Other specified abnormal immunological findings in serum: Secondary | ICD-10-CM | POA: Diagnosis not present

## 2021-10-12 DIAGNOSIS — M25569 Pain in unspecified knee: Secondary | ICD-10-CM | POA: Diagnosis not present

## 2021-10-12 DIAGNOSIS — Z79899 Other long term (current) drug therapy: Secondary | ICD-10-CM | POA: Diagnosis not present

## 2021-10-12 DIAGNOSIS — I2699 Other pulmonary embolism without acute cor pulmonale: Secondary | ICD-10-CM | POA: Diagnosis not present

## 2021-10-12 DIAGNOSIS — M0609 Rheumatoid arthritis without rheumatoid factor, multiple sites: Secondary | ICD-10-CM | POA: Diagnosis not present

## 2021-10-12 DIAGNOSIS — M1990 Unspecified osteoarthritis, unspecified site: Secondary | ICD-10-CM | POA: Diagnosis not present

## 2021-10-21 ENCOUNTER — Ambulatory Visit (INDEPENDENT_AMBULATORY_CARE_PROVIDER_SITE_OTHER): Payer: Medicare Other | Admitting: Pulmonary Disease

## 2021-10-21 ENCOUNTER — Other Ambulatory Visit: Payer: Self-pay

## 2021-10-21 ENCOUNTER — Encounter: Payer: Self-pay | Admitting: Pulmonary Disease

## 2021-10-21 VITALS — BP 130/70 | HR 64 | Temp 98.5°F | Ht 65.0 in | Wt 230.2 lb

## 2021-10-21 DIAGNOSIS — I2609 Other pulmonary embolism with acute cor pulmonale: Secondary | ICD-10-CM

## 2021-10-21 DIAGNOSIS — Z7901 Long term (current) use of anticoagulants: Secondary | ICD-10-CM | POA: Diagnosis not present

## 2021-10-21 NOTE — Patient Instructions (Signed)
Nice to see you again  Continue the Eliquis 5 mg twice a day indefinitely  Our office will be in contact regarding the results of next weeks heart ultrasound  I am optimistic things will be improved and we will just need to meet once a year for refills of the blood thinner or at any time your breathing becomes a concern  Return to clinic in 11 months or sooner as needed
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
## 2021-10-28 ENCOUNTER — Other Ambulatory Visit: Payer: Self-pay

## 2021-10-28 ENCOUNTER — Ambulatory Visit (HOSPITAL_COMMUNITY): Payer: Medicare Other | Attending: Cardiovascular Disease

## 2021-10-28 DIAGNOSIS — I2609 Other pulmonary embolism with acute cor pulmonale: Secondary | ICD-10-CM | POA: Diagnosis not present

## 2021-10-28 LAB — ECHOCARDIOGRAM COMPLETE
Area-P 1/2: 3.23 cm2
S' Lateral: 3.4 cm

## 2021-11-04 ENCOUNTER — Ambulatory Visit (INDEPENDENT_AMBULATORY_CARE_PROVIDER_SITE_OTHER): Payer: Medicare Other | Admitting: Emergency Medicine

## 2021-11-04 ENCOUNTER — Encounter: Payer: Self-pay | Admitting: Emergency Medicine

## 2021-11-04 ENCOUNTER — Other Ambulatory Visit: Payer: Self-pay

## 2021-11-04 VITALS — BP 128/70 | HR 92 | Temp 98.5°F | Ht 65.0 in | Wt 229.0 lb

## 2021-11-04 DIAGNOSIS — I10 Essential (primary) hypertension: Secondary | ICD-10-CM | POA: Diagnosis not present

## 2021-11-04 DIAGNOSIS — Z23 Encounter for immunization: Secondary | ICD-10-CM | POA: Diagnosis not present

## 2021-11-04 DIAGNOSIS — Z7901 Long term (current) use of anticoagulants: Secondary | ICD-10-CM

## 2021-11-04 DIAGNOSIS — I2609 Other pulmonary embolism with acute cor pulmonale: Secondary | ICD-10-CM | POA: Diagnosis not present

## 2021-11-04 DIAGNOSIS — E785 Hyperlipidemia, unspecified: Secondary | ICD-10-CM | POA: Diagnosis not present

## 2021-11-04 DIAGNOSIS — Z86711 Personal history of pulmonary embolism: Secondary | ICD-10-CM

## 2021-11-04 NOTE — Assessment & Plan Note (Signed)
On Eliquis 5 mg twice a day.  Fall precautions given.

## 2021-11-04 NOTE — Assessment & Plan Note (Signed)
Diet and nutrition discussed.  Continue rosuvastatin 10 mg daily. The 10-year ASCVD risk score (Arnett DK, et al., 2019) is: 19.7%   Values used to calculate the score:     Age: 71 years     Sex: Male     Is Non-Hispanic African American: Yes     Diabetic: No     Tobacco smoker: No     Systolic Blood Pressure: 915 mmHg     Is BP treated: Yes     HDL Cholesterol: 52 mg/dL     Total Cholesterol: 239 mg/dL

## 2021-11-04 NOTE — Assessment & Plan Note (Signed)
Well-controlled hypertension. BP Readings from Last 3 Encounters:  11/04/21 128/70  10/21/21 130/70  08/25/21 126/64  Continue valsartan 80 mg and hydrochlorothiazide 12.5 mg daily.

## 2021-11-04 NOTE — Assessment & Plan Note (Addendum)
Unprovoked.  Much improved.  No shortness of breath. Recent echocardiogram shows normal right ventricular function. On long-term anticoagulation indefinitely.

## 2021-11-04 NOTE — Patient Instructions (Signed)
Health Maintenance After Age 70 After age 70, you are at a higher risk for certain long-term diseases and infections as well as injuries from falls. Falls are a major cause of broken bones and head injuries in people who are older than age 70. Getting regular preventive care can help to keep you healthy and well. Preventive care includes getting regular testing and making lifestyle changes as recommended by your health care provider. Talk with your health care provider about: Which screenings and tests you should have. A screening is a test that checks for a disease when you have no symptoms. A diet and exercise plan that is right for you. What should I know about screenings and tests to prevent falls? Screening and testing are the best ways to find a health problem early. Early diagnosis and treatment give you the best chance of managing medical conditions that are common after age 70. Certain conditions and lifestyle choices may make you more likely to have a fall. Your health care provider may recommend: Regular vision checks. Poor vision and conditions such as cataracts can make you more likely to have a fall. If you wear glasses, make sure to get your prescription updated if your vision changes. Medicine review. Work with your health care provider to regularly review all of the medicines you are taking, including over-the-counter medicines. Ask your health care provider about any side effects that may make you more likely to have a fall. Tell your health care provider if any medicines that you take make you feel dizzy or sleepy. Strength and balance checks. Your health care provider may recommend certain tests to check your strength and balance while standing, walking, or changing positions. Foot health exam. Foot pain and numbness, as well as not wearing proper footwear, can make you more likely to have a fall. Screenings, including: Osteoporosis screening. Osteoporosis is a condition that causes  the bones to get weaker and break more easily. Blood pressure screening. Blood pressure changes and medicines to control blood pressure can make you feel dizzy. Depression screening. You may be more likely to have a fall if you have a fear of falling, feel depressed, or feel unable to do activities that you used to do. Alcohol use screening. Using too much alcohol can affect your balance and may make you more likely to have a fall. Follow these instructions at home: Lifestyle Do not drink alcohol if: Your health care provider tells you not to drink. If you drink alcohol: Limit how much you have to: 0-1 drink a day for women. 0-2 drinks a day for men. Know how much alcohol is in your drink. In the [place] one drink equals one 12 oz bottle of beer (355 mL), one 5 oz glass of wine (148 mL), or one 1 oz glass of hard liquor (44 mL). Do not use any products that contain nicotine or tobacco. These products include cigarettes, chewing tobacco, and vaping devices, such as e-cigarettes. If you need help quitting, ask your health care provider. Activity  Follow a regular exercise program to stay fit. This will help you maintain your balance. Ask your health care provider what types of exercise are appropriate for you. If you need a cane or walker, use it as recommended by your health care provider. Wear supportive shoes that have nonskid soles. Safety  Remove any tripping hazards, such as rugs, cords, and clutter. Install safety equipment such as grab bars in bathrooms and safety rails on stairs. Keep rooms and walkways   well-lit. General instructions Talk with your health care provider about your risks for falling. Tell your health care provider if: You fall. Be sure to tell your health care provider about all falls, even ones that seem minor. You feel dizzy, tiredness (fatigue), or off-balance. Take over-the-counter and prescription medicines only as told by your health care provider. These include  supplements. Eat a healthy diet and maintain a healthy weight. A healthy diet includes low-fat dairy products, low-fat (lean) meats, and fiber from whole grains, beans, and lots of fruits and vegetables. Stay current with your vaccines. Schedule regular health, dental, and eye exams. Summary Having a healthy lifestyle and getting preventive care can help to protect your health and wellness after age 70. Screening and testing are the best way to find a health problem early and help you avoid having a fall. Early diagnosis and treatment give you the best chance for managing medical conditions that are more common for people who are older than age 70. Falls are a major cause of broken bones and head injuries in people who are older than age 70. Take precautions to prevent a fall at home. Work with your health care provider to learn what changes you can make to improve your health and wellness and to prevent falls. This information is not intended to replace advice given to you by your health care provider. Make sure you discuss any questions you have with your health care provider. Document Revised: 04/20/2021 Document Reviewed: 04/20/2021 [employer] Patient Education  2022 [employer]  
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
## 2021-12-03 DIAGNOSIS — M1990 Unspecified osteoarthritis, unspecified site: Secondary | ICD-10-CM | POA: Diagnosis not present

## 2021-12-03 DIAGNOSIS — Z79899 Other long term (current) drug therapy: Secondary | ICD-10-CM | POA: Diagnosis not present

## 2021-12-03 DIAGNOSIS — M0609 Rheumatoid arthritis without rheumatoid factor, multiple sites: Secondary | ICD-10-CM | POA: Diagnosis not present

## 2021-12-03 DIAGNOSIS — L0390 Cellulitis, unspecified: Secondary | ICD-10-CM | POA: Diagnosis not present

## 2021-12-03 DIAGNOSIS — R768 Other specified abnormal immunological findings in serum: Secondary | ICD-10-CM | POA: Diagnosis not present

## 2021-12-03 DIAGNOSIS — M79604 Pain in right leg: Secondary | ICD-10-CM | POA: Diagnosis not present

## 2021-12-03 DIAGNOSIS — M25569 Pain in unspecified knee: Secondary | ICD-10-CM | POA: Diagnosis not present

## 2021-12-22 ENCOUNTER — Telehealth: Payer: Self-pay | Admitting: Pulmonary Disease

## 2021-12-22 NOTE — Telephone Encounter (Signed)
I called the patient and let him know that his PCP had sent in his medications. He voices understanding.

## 2022-02-05 ENCOUNTER — Other Ambulatory Visit: Payer: Self-pay

## 2022-02-05 ENCOUNTER — Encounter: Payer: Self-pay | Admitting: Nurse Practitioner

## 2022-02-05 ENCOUNTER — Ambulatory Visit: Payer: Medicare PPO | Admitting: Nurse Practitioner

## 2022-02-05 ENCOUNTER — Other Ambulatory Visit: Payer: Self-pay | Admitting: Nurse Practitioner

## 2022-02-05 VITALS — BP 140/86 | HR 76 | Temp 98.4°F | Ht 65.0 in | Wt 235.0 lb

## 2022-02-05 DIAGNOSIS — R319 Hematuria, unspecified: Secondary | ICD-10-CM | POA: Diagnosis not present

## 2022-02-05 LAB — CBC WITH DIFFERENTIAL/PLATELET
Basophils Absolute: 0.1 10*3/uL (ref 0.0–0.1)
Basophils Relative: 2.2 % (ref 0.0–3.0)
Eosinophils Absolute: 0.1 10*3/uL (ref 0.0–0.7)
Eosinophils Relative: 1.6 % (ref 0.0–5.0)
HCT: 46.2 % (ref 39.0–52.0)
Hemoglobin: 15 g/dL (ref 13.0–17.0)
Lymphocytes Relative: 24.3 % (ref 12.0–46.0)
Lymphs Abs: 1.6 10*3/uL (ref 0.7–4.0)
MCHC: 32.4 g/dL (ref 30.0–36.0)
MCV: 90.3 fl (ref 78.0–100.0)
Monocytes Absolute: 0.7 10*3/uL (ref 0.1–1.0)
Monocytes Relative: 11.1 % (ref 3.0–12.0)
Neutro Abs: 4 10*3/uL (ref 1.4–7.7)
Neutrophils Relative %: 60.8 % (ref 43.0–77.0)
Platelets: 236 10*3/uL (ref 150.0–400.0)
RBC: 5.12 Mil/uL (ref 4.22–5.81)
RDW: 16.2 % — ABNORMAL HIGH (ref 11.5–15.5)
WBC: 6.7 10*3/uL (ref 4.0–10.5)

## 2022-02-05 LAB — COMPREHENSIVE METABOLIC PANEL
ALT: 21 U/L (ref 0–53)
AST: 21 U/L (ref 0–37)
Albumin: 4.3 g/dL (ref 3.5–5.2)
Alkaline Phosphatase: 46 U/L (ref 39–117)
BUN: 15 mg/dL (ref 6–23)
CO2: 30 mEq/L (ref 19–32)
Calcium: 10 mg/dL (ref 8.4–10.5)
Chloride: 104 mEq/L (ref 96–112)
Creatinine, Ser: 1.17 mg/dL (ref 0.40–1.50)
GFR: 63.25 mL/min (ref >60.00)
Glucose, Bld: 88 mg/dL (ref 70–99)
Potassium: 4.5 mEq/L (ref 3.5–5.1)
Sodium: 138 mEq/L (ref 135–145)
Total Bilirubin: 1.5 mg/dL — ABNORMAL HIGH (ref 0.2–1.2)
Total Protein: 7.3 g/dL (ref 6.0–8.3)

## 2022-02-05 LAB — POCT URINALYSIS DIPSTICK
Bilirubin, UA: NEGATIVE
Blood, UA: NEGATIVE
Glucose, UA: NEGATIVE
Ketones, UA: NEGATIVE
Leukocytes, UA: NEGATIVE
Nitrite, UA: NEGATIVE
Protein, UA: POSITIVE — AB
Spec Grav, UA: 1.025 (ref 1.010–1.025)
Urobilinogen, UA: 0.2 E.U./dL
pH, UA: 6 (ref 5.0–8.0)

## 2022-02-05 LAB — URINALYSIS WITH CULTURE, IF INDICATED
Bilirubin Urine: NEGATIVE
Ketones, ur: NEGATIVE
Leukocytes,Ua: NEGATIVE
Nitrite: NEGATIVE
Specific Gravity, Urine: 1.02 (ref 1.000–1.030)
Total Protein, Urine: NEGATIVE
Urine Glucose: NEGATIVE
Urobilinogen, UA: 0.2 (ref 0.0–1.0)
pH: 5.5 (ref 5.0–8.0)

## 2022-02-05 LAB — PSA: PSA: 1.03 ng/mL (ref 0.10–4.00)
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
## 2022-03-15 ENCOUNTER — Ambulatory Visit: Payer: Medicare PPO | Admitting: Emergency Medicine

## 2022-03-15 ENCOUNTER — Encounter: Payer: Self-pay | Admitting: Emergency Medicine

## 2022-03-15 VITALS — BP 132/76 | HR 55 | Temp 98.6°F | Ht 65.0 in | Wt 234.1 lb

## 2022-03-15 DIAGNOSIS — I10 Essential (primary) hypertension: Secondary | ICD-10-CM | POA: Diagnosis not present

## 2022-03-15 DIAGNOSIS — R319 Hematuria, unspecified: Secondary | ICD-10-CM

## 2022-03-15 DIAGNOSIS — E785 Hyperlipidemia, unspecified: Secondary | ICD-10-CM

## 2022-03-15 LAB — POCT URINALYSIS DIPSTICK
Bilirubin, UA: NEGATIVE
Glucose, UA: NEGATIVE
Ketones, UA: NEGATIVE
Nitrite, UA: NEGATIVE
Protein, UA: POSITIVE — AB
Spec Grav, UA: 1.025 (ref 1.010–1.025)
Urobilinogen, UA: 0.2 E.U./dL
pH, UA: 6 (ref 5.0–8.0)

## 2022-03-15 NOTE — Assessment & Plan Note (Signed)
Stable and asymptomatic.  Trace blood in the urine.  No concerns. Continue Eliquis 5 mg twice a day.
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
## 2022-03-15 NOTE — Assessment & Plan Note (Signed)
Stable.  Continue rosuvastatin 10 mg daily. 

## 2022-03-15 NOTE — Patient Instructions (Signed)
Health Maintenance After Age 71 After age 71, you are at a higher risk for certain long-term diseases and infections as well as injuries from falls. Falls are a major cause of broken bones and head injuries in people who are older than age 71. Getting regular preventive care can help to keep you healthy and well. Preventive care includes getting regular testing and making lifestyle changes as recommended by your health care provider. Talk with your health care provider about: Which screenings and tests you should have. A screening is a test that checks for a disease when you have no symptoms. A diet and exercise plan that is right for you. What should I know about screenings and tests to prevent falls? Screening and testing are the best ways to find a health problem early. Early diagnosis and treatment give you the best chance of managing medical conditions that are common after age 71. Certain conditions and lifestyle choices may make you more likely to have a fall. Your health care provider may recommend: Regular vision checks. Poor vision and conditions such as cataracts can make you more likely to have a fall. If you wear glasses, make sure to get your prescription updated if your vision changes. Medicine review. Work with your health care provider to regularly review all of the medicines you are taking, including over-the-counter medicines. Ask your health care provider about any side effects that may make you more likely to have a fall. Tell your health care provider if any medicines that you take make you feel dizzy or sleepy. Strength and balance checks. Your health care provider may recommend certain tests to check your strength and balance while standing, walking, or changing positions. Foot health exam. Foot pain and numbness, as well as not wearing proper footwear, can make you more likely to have a fall. Screenings, including: Osteoporosis screening. Osteoporosis is a condition that causes  the bones to get weaker and break more easily. Blood pressure screening. Blood pressure changes and medicines to control blood pressure can make you feel dizzy. Depression screening. You may be more likely to have a fall if you have a fear of falling, feel depressed, or feel unable to do activities that you used to do. Alcohol use screening. Using too much alcohol can affect your balance and may make you more likely to have a fall. Follow these instructions at home: Lifestyle Do not drink alcohol if: Your health care provider tells you not to drink. If you drink alcohol: Limit how much you have to: 0-1 drink a day for women. 0-2 drinks a day for men. Know how much alcohol is in your drink. In the [place] one drink equals one 12 oz bottle of beer (355 mL), one 5 oz glass of wine (148 mL), or one 1 oz glass of hard liquor (44 mL). Do not use any products that contain nicotine or tobacco. These products include cigarettes, chewing tobacco, and vaping devices, such as e-cigarettes. If you need help quitting, ask your health care provider. Activity  Follow a regular exercise program to stay fit. This will help you maintain your balance. Ask your health care provider what types of exercise are appropriate for you. If you need a cane or walker, use it as recommended by your health care provider. Wear supportive shoes that have nonskid soles. Safety  Remove any tripping hazards, such as rugs, cords, and clutter. Install safety equipment such as grab bars in bathrooms and safety rails on stairs. Keep rooms and walkways   well-lit. General instructions Talk with your health care provider about your risks for falling. Tell your health care provider if: You fall. Be sure to tell your health care provider about all falls, even ones that seem minor. You feel dizzy, tiredness (fatigue), or off-balance. Take over-the-counter and prescription medicines only as told by your health care provider. These include  supplements. Eat a healthy diet and maintain a healthy weight. A healthy diet includes low-fat dairy products, low-fat (lean) meats, and fiber from whole grains, beans, and lots of fruits and vegetables. Stay current with your vaccines. Schedule regular health, dental, and eye exams. Summary Having a healthy lifestyle and getting preventive care can help to protect your health and wellness after age 71. Screening and testing are the best way to find a health problem early and help you avoid having a fall. Early diagnosis and treatment give you the best chance for managing medical conditions that are more common for people who are older than age 71. Falls are a major cause of broken bones and head injuries in people who are older than age 71. Take precautions to prevent a fall at home. Work with your health care provider to learn what changes you can make to improve your health and wellness and to prevent falls. This information is not intended to replace advice given to you by your health care provider. Make sure you discuss any questions you have with your health care provider. Document Revised: 04/20/2021 Document Reviewed: 04/20/2021 Elsevier Patient Education  2022 Elsevier Inc[employer]  

## 2022-03-15 NOTE — Assessment & Plan Note (Signed)
Well-controlled hypertension.  Continue valsartan 80 mg and hydrochlorothiazide 12.5 mg daily.

## 2022-03-18 ENCOUNTER — Other Ambulatory Visit: Payer: Self-pay | Admitting: Emergency Medicine

## 2022-03-18 DIAGNOSIS — N3001 Acute cystitis with hematuria: Secondary | ICD-10-CM

## 2022-03-18 LAB — URINE CULTURE

## 2022-03-18 MED ORDER — CIPROFLOXACIN HCL 500 MG PO TABS: 500.0000 mg | ORAL_TABLET | Freq: Two times a day (BID) | ORAL | 0 refills | Status: AC

## 2022-03-18 NOTE — Progress Notes (Signed)
Urine culture positive for Citrobacter.  Sensitive to multiple antibiotics. We will start Cipro 500 mg twice a day for 7 days. We will send message through MyChart and also call patient.

## 2022-04-01 ENCOUNTER — Telehealth: Payer: Self-pay

## 2022-04-01 NOTE — Telephone Encounter (Signed)
I advise the pt that Dr. Mitchel Honour says try Benadryl instead.  If it does not help, be seen at urgent care center.  Pt understood and had no further questions  FYI

## 2022-04-01 NOTE — Telephone Encounter (Signed)
Pt is calling to see what he can take for Hayfever. Pt is taking OTC Claritin and he isn't getting any relief.  Please advise

## 2022-04-01 NOTE — Telephone Encounter (Signed)
Can try Benadryl instead.  If it does not help, be seen at urgent care center.

## 2022-04-15 ENCOUNTER — Other Ambulatory Visit: Payer: Self-pay | Admitting: *Deleted

## 2022-04-15 MED ORDER — APIXABAN 5 MG PO TABS: 5.0000 mg | ORAL_TABLET | Freq: Two times a day (BID) | ORAL | 3 refills | Status: DC

## 2022-04-23 DIAGNOSIS — R030 Elevated blood-pressure reading, without diagnosis of hypertension: Secondary | ICD-10-CM | POA: Diagnosis not present

## 2022-04-23 DIAGNOSIS — D8481 Immunodeficiency due to conditions classified elsewhere: Secondary | ICD-10-CM | POA: Diagnosis not present

## 2022-04-23 DIAGNOSIS — M069 Rheumatoid arthritis, unspecified: Secondary | ICD-10-CM | POA: Diagnosis not present

## 2022-04-23 DIAGNOSIS — Z811 Family history of alcohol abuse and dependence: Secondary | ICD-10-CM | POA: Diagnosis not present

## 2022-04-23 DIAGNOSIS — Z8249 Family history of ischemic heart disease and other diseases of the circulatory system: Secondary | ICD-10-CM | POA: Diagnosis not present

## 2022-04-23 DIAGNOSIS — Z79631 Long term (current) use of antimetabolite agent: Secondary | ICD-10-CM | POA: Diagnosis not present

## 2022-04-23 DIAGNOSIS — Z803 Family history of malignant neoplasm of breast: Secondary | ICD-10-CM | POA: Diagnosis not present

## 2022-04-23 DIAGNOSIS — D84821 Immunodeficiency due to drugs: Secondary | ICD-10-CM | POA: Diagnosis not present

## 2022-04-23 DIAGNOSIS — Z7901 Long term (current) use of anticoagulants: Secondary | ICD-10-CM | POA: Diagnosis not present

## 2022-04-26 ENCOUNTER — Ambulatory Visit: Payer: Medicare PPO | Admitting: Internal Medicine

## 2022-04-26 ENCOUNTER — Encounter: Payer: Self-pay | Admitting: Internal Medicine

## 2022-04-26 VITALS — BP 132/70 | HR 83 | Resp 18 | Ht 65.0 in | Wt 239.4 lb

## 2022-04-26 DIAGNOSIS — M7989 Other specified soft tissue disorders: Secondary | ICD-10-CM

## 2022-04-26 MED ORDER — FUROSEMIDE 40 MG PO TABS: 40.0000 mg | ORAL_TABLET | Freq: Every day | ORAL | 0 refills | Status: DC

## 2022-04-26 NOTE — Patient Instructions (Signed)
We have sent in the fluid pill lasix (furosemide) to take 1 pill daily to help the leg go down for 5 days.  Then stop and see how you do.

## 2022-04-26 NOTE — Progress Notes (Signed)
   Subjective:   Patient ID: Jerome Lee, male    DOB: September 24, 1951, 71 y.o.   MRN: [id number]  HPI The patient is a 71 YO man coming in for right leg swelling. No new pain. Not elevating legs. No change in diet but diet is not ideal for sodium consumption. Has not missed any doses of eliquis. Prior DVT and PE.   Review of Systems  Constitutional: Negative.   [name]: Negative.    Eyes: Negative.   Respiratory:  Negative for cough, chest tightness and shortness of breath.   Cardiovascular:  Positive for leg swelling. Negative for chest pain and palpitations.  Gastrointestinal:  Negative for abdominal distention, abdominal pain, constipation, diarrhea, nausea and vomiting.  Musculoskeletal:  Positive for arthralgias.  Skin: Negative.   Neurological: Negative.   Psychiatric/Behavioral: Negative.     Objective:  Physical Exam Constitutional:      Appearance: He is well-developed.  [name]:     Head: Normocephalic and atraumatic.  Cardiovascular:     Rate and Rhythm: Normal rate and regular rhythm.  Pulmonary:     Effort: Pulmonary effort is normal. No respiratory distress.     Breath sounds: Normal breath sounds. No wheezing or rales.  Abdominal:     General: Bowel sounds are normal. There is no distension.     Palpations: Abdomen is soft.     Tenderness: There is no abdominal tenderness. There is no rebound.  Musculoskeletal:     Cervical back: Normal range of motion.     Right lower leg: Edema present.     Left lower leg: Edema present.     Comments: Trace edema left leg and 2+ edema to the knee right leg  Skin:    General: Skin is warm and dry.  Neurological:     Mental Status: He is alert and oriented to person, place, and time.     Coordination: Coordination normal.    Vitals:   04/26/22 1335  BP: 132/70  Pulse: 83  Resp: 18  SpO2: 98%  Weight: 239 lb 6.4 oz (108.6 kg)  Height: '5\' 5"'$  (1.651 m)    Assessment & Plan:  Visit time 20 minutes in face to face  communication with patient and coordination of care, additional 15 minutes spent in record review, coordination or care, ordering tests, communicating/referring to other healthcare professionals, documenting in medical records all on the same day of the visit for total time 35 minutes spent on the visit.

## 2022-04-27 DIAGNOSIS — M7989 Other specified soft tissue disorders: Secondary | ICD-10-CM | POA: Insufficient documentation

## 2022-04-27 NOTE — Assessment & Plan Note (Addendum)
Recent labs without signs of renal/liver disease and recent echo Nov 2022 without signs of heart failure. He has not missed doses of his eliquis making DVT unlikely. The legs are not painful and are both swollen. We will try lasix 40 mg daily for 5 days as well as compression stockings long term and given rx for this. Talked to him as well about reduction of sodium in his diet.

## 2022-07-14 ENCOUNTER — Ambulatory Visit: Payer: Medicare PPO

## 2022-07-14 VITALS — BP 122/72 | HR 78 | Resp 18 | Ht 65.0 in | Wt 241.0 lb

## 2022-07-14 DIAGNOSIS — Z0000 Encounter for general adult medical examination without abnormal findings: Secondary | ICD-10-CM
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
## 2022-07-14 NOTE — Patient Instructions (Signed)
It was a pleasure meeting you today.  Please think about getting your Advanced Directive in place, I have provided you with information   Stay hydrated and cool in the weather   Please follow up in 1 year

## 2022-07-16 DIAGNOSIS — M8580 Other specified disorders of bone density and structure, unspecified site: Secondary | ICD-10-CM | POA: Diagnosis not present

## 2022-07-16 DIAGNOSIS — Z79899 Other long term (current) drug therapy: Secondary | ICD-10-CM | POA: Diagnosis not present

## 2022-07-16 DIAGNOSIS — M1990 Unspecified osteoarthritis, unspecified site: Secondary | ICD-10-CM | POA: Diagnosis not present

## 2022-07-16 DIAGNOSIS — M0609 Rheumatoid arthritis without rheumatoid factor, multiple sites: Secondary | ICD-10-CM | POA: Diagnosis not present

## 2022-07-16 DIAGNOSIS — M25569 Pain in unspecified knee: Secondary | ICD-10-CM | POA: Diagnosis not present

## 2022-07-16 DIAGNOSIS — R768 Other specified abnormal immunological findings in serum: Secondary | ICD-10-CM | POA: Diagnosis not present

## 2022-09-14 ENCOUNTER — Encounter: Payer: Self-pay | Admitting: Emergency Medicine

## 2022-09-14 ENCOUNTER — Emergency Department (HOSPITAL_COMMUNITY)
Admission: EM | Admit: 2022-09-14 | Discharge: 2022-09-14 | Disposition: A | Discharge status: Home / Self Care | Payer: Medicare PPO | Attending: Emergency Medicine | Admitting: Emergency Medicine

## 2022-09-14 ENCOUNTER — Other Ambulatory Visit: Payer: Self-pay

## 2022-09-14 ENCOUNTER — Emergency Department (HOSPITAL_COMMUNITY): Payer: Medicare PPO

## 2022-09-14 ENCOUNTER — Ambulatory Visit: Payer: Medicare PPO | Admitting: Emergency Medicine

## 2022-09-14 VITALS — BP 138/84 | HR 71 | Temp 98.3°F | Ht 65.0 in | Wt 239.0 lb

## 2022-09-14 DIAGNOSIS — Z20822 Contact with and (suspected) exposure to covid-19: Secondary | ICD-10-CM | POA: Diagnosis not present

## 2022-09-14 DIAGNOSIS — E785 Hyperlipidemia, unspecified: Secondary | ICD-10-CM | POA: Diagnosis not present

## 2022-09-14 DIAGNOSIS — Y908 Blood alcohol level of 240 mg/100 ml or more: Secondary | ICD-10-CM | POA: Insufficient documentation

## 2022-09-14 DIAGNOSIS — M069 Rheumatoid arthritis, unspecified: Secondary | ICD-10-CM

## 2022-09-14 DIAGNOSIS — R2681 Unsteadiness on feet: Secondary | ICD-10-CM | POA: Insufficient documentation

## 2022-09-14 DIAGNOSIS — I10 Essential (primary) hypertension: Secondary | ICD-10-CM | POA: Insufficient documentation

## 2022-09-14 DIAGNOSIS — Z7901 Long term (current) use of anticoagulants: Secondary | ICD-10-CM

## 2022-09-14 DIAGNOSIS — S0990XA Unspecified injury of head, initial encounter: Secondary | ICD-10-CM | POA: Insufficient documentation

## 2022-09-14 DIAGNOSIS — Z79899 Other long term (current) drug therapy: Secondary | ICD-10-CM | POA: Insufficient documentation

## 2022-09-14 DIAGNOSIS — W19XXXA Unspecified fall, initial encounter: Secondary | ICD-10-CM | POA: Insufficient documentation

## 2022-09-14 DIAGNOSIS — Z86711 Personal history of pulmonary embolism: Secondary | ICD-10-CM | POA: Diagnosis not present

## 2022-09-14 DIAGNOSIS — Z23 Encounter for immunization: Secondary | ICD-10-CM

## 2022-09-14 DIAGNOSIS — Y92009 Unspecified place in unspecified non-institutional (private) residence as the place of occurrence of the external cause: Secondary | ICD-10-CM | POA: Diagnosis not present

## 2022-09-14 DIAGNOSIS — F10129 Alcohol abuse with intoxication, unspecified: Secondary | ICD-10-CM | POA: Diagnosis not present

## 2022-09-14 LAB — CBC WITH DIFFERENTIAL/PLATELET
Abs Immature Granulocytes: 0.06 10*3/uL (ref 0.00–0.07)
Basophils Absolute: 0.1 10*3/uL (ref 0.0–0.1)
Basophils Relative: 1 %
Eosinophils Absolute: 0.1 10*3/uL (ref 0.0–0.5)
Eosinophils Relative: 1 %
HCT: 47.7 % (ref 39.0–52.0)
Hemoglobin: 15.5 g/dL (ref 13.0–17.0)
Immature Granulocytes: 1 %
Lymphocytes Relative: 21 %
Lymphs Abs: 2.1 10*3/uL (ref 0.7–4.0)
MCH: 28.9 pg (ref 26.0–34.0)
MCHC: 32.5 g/dL (ref 30.0–36.0)
MCV: 89 fL (ref 80.0–100.0)
Monocytes Absolute: 0.7 10*3/uL (ref 0.1–1.0)
Monocytes Relative: 8 %
Neutro Abs: 6.8 10*3/uL (ref 1.7–7.7)
Neutrophils Relative %: 68 %
Platelets: 302 10*3/uL (ref 150–400)
RBC: 5.36 MIL/uL (ref 4.22–5.81)
RDW: 14.1 % (ref 11.5–15.5)
WBC: 9.9 10*3/uL (ref 4.0–10.5)
nRBC: 0 % (ref 0.0–0.2)

## 2022-09-14 LAB — URINALYSIS, ROUTINE W REFLEX MICROSCOPIC
Bacteria, UA: NONE SEEN
Bilirubin Urine: NEGATIVE
Glucose, UA: NEGATIVE mg/dL
Ketones, ur: NEGATIVE mg/dL
Leukocytes,Ua: NEGATIVE
Nitrite: NEGATIVE
Protein, ur: NEGATIVE mg/dL
Specific Gravity, Urine: 1.002 — ABNORMAL LOW (ref 1.005–1.030)
pH: 6 (ref 5.0–8.0)

## 2022-09-14 LAB — SARS CORONAVIRUS 2 BY RT PCR: SARS Coronavirus 2 by RT PCR: NEGATIVE

## 2022-09-14 LAB — COMPREHENSIVE METABOLIC PANEL
ALT: 28 U/L (ref 0–44)
AST: 31 U/L (ref 15–41)
Albumin: 4.1 g/dL (ref 3.5–5.0)
Alkaline Phosphatase: 46 U/L (ref 38–126)
Anion gap: 10 (ref 5–15)
BUN: 11 mg/dL (ref 8–23)
CO2: 23 mmol/L (ref 22–32)
Calcium: 9.7 mg/dL (ref 8.9–10.3)
Chloride: 109 mmol/L (ref 98–111)
Creatinine, Ser: 1.28 mg/dL — ABNORMAL HIGH (ref 0.61–1.24)
GFR, Estimated: >60 mL/min (ref >60)
Glucose, Bld: 94 mg/dL (ref 70–99)
Potassium: 3.9 mmol/L (ref 3.5–5.1)
Sodium: 142 mmol/L (ref 135–145)
Total Bilirubin: 0.8 mg/dL (ref 0.3–1.2)
Total Protein: 7.3 g/dL (ref 6.5–8.1)

## 2022-09-14 LAB — ETHANOL: Alcohol, Ethyl (B): 256 mg/dL — ABNORMAL HIGH (ref <10)

## 2022-09-14 LAB — RAPID URINE DRUG SCREEN, HOSP PERFORMED
Amphetamines: NOT DETECTED
Barbiturates: NOT DETECTED
Benzodiazepines: NOT DETECTED
Cocaine: NOT DETECTED
Opiates: NOT DETECTED
Tetrahydrocannabinol: NOT DETECTED

## 2022-09-14 MED ORDER — ROSUVASTATIN CALCIUM 10 MG PO TABS: 10.0000 mg | ORAL_TABLET | Freq: Every day | ORAL | 3 refills | Status: DC

## 2022-09-14 MED ORDER — VALSARTAN 80 MG PO TABS: 80.0000 mg | ORAL_TABLET | Freq: Every day | ORAL | 3 refills | Status: DC

## 2022-09-14 MED ORDER — SODIUM CHLORIDE 0.9 % IV BOLUS
500.0000 mL | Freq: Once | INTRAVENOUS | Status: AC
  Administered 2022-09-14: 500 mL via INTRAVENOUS

## 2022-09-14 NOTE — Patient Instructions (Signed)
Hypertension, Adult High blood pressure (hypertension) is when the force of blood pumping through the arteries is too strong. The arteries are the blood vessels that carry blood from the heart throughout the body. Hypertension forces the heart to work harder to pump blood and may cause arteries to become narrow or stiff. Untreated or uncontrolled hypertension can lead to a heart attack, heart failure, a stroke, kidney disease, and other problems. A blood pressure reading consists of a higher number over a lower number. Ideally, your blood pressure should be below 120/80. The first ("top") number is called the systolic pressure. It is a measure of the pressure in your arteries as your heart beats. The second ("bottom") number is called the diastolic pressure. It is a measure of the pressure in your arteries as the heart relaxes. What are the causes? The exact cause of this condition is not known. There are some conditions that result in high blood pressure. What increases the risk? Certain factors may make you more likely to develop high blood pressure. Some of these risk factors are under your control, including: Smoking. Not getting enough exercise or physical activity. Being overweight. Having too much fat, sugar, calories, or salt (sodium) in your diet. Drinking too much alcohol. Other risk factors include: Having a personal history of heart disease, diabetes, high cholesterol, or kidney disease. Stress. Having a family history of high blood pressure and high cholesterol. Having obstructive sleep apnea. Age. The risk increases with age. What are the signs or symptoms? High blood pressure may not cause symptoms. Very high blood pressure (hypertensive crisis) may cause: Headache. Fast or irregular heartbeats (palpitations). Shortness of breath. Nosebleed. Nausea and vomiting. Vision changes. Severe chest pain, dizziness, and seizures. How is this diagnosed? This condition is diagnosed by  measuring your blood pressure while you are seated, with your arm resting on a flat surface, your legs uncrossed, and your feet flat on the floor. The cuff of the blood pressure monitor will be placed directly against the skin of your upper arm at the level of your heart. Blood pressure should be measured at least twice using the same arm. Certain conditions can cause a difference in blood pressure between your right and left arms. If you have a high blood pressure reading during one visit or you have normal blood pressure with other risk factors, you may be asked to: Return on a different day to have your blood pressure checked again. Monitor your blood pressure at home for 1 week or longer. If you are diagnosed with hypertension, you may have other blood or imaging tests to help your health care provider understand your overall risk for other conditions. How is this treated? This condition is treated by making healthy lifestyle changes, such as eating healthy foods, exercising more, and reducing your alcohol intake. You may be referred for counseling on a healthy diet and physical activity. Your health care provider may prescribe medicine if lifestyle changes are not enough to get your blood pressure under control and if: Your systolic blood pressure is above 130. Your diastolic blood pressure is above 80. Your personal target blood pressure may vary depending on your medical conditions, your age, and other factors. Follow these instructions at home: Eating and drinking  Eat a diet that is high in fiber and potassium, and low in sodium, added sugar, and fat. An example of this eating plan is called the DASH diet. DASH stands for Dietary Approaches to Stop Hypertension. To eat this way: Eat   plenty of fresh fruits and vegetables. Try to fill one half of your plate at each meal with fruits and vegetables. Eat whole grains, such as whole-wheat pasta, brown rice, or whole-grain bread. Fill about one  fourth of your plate with whole grains. Eat or drink low-fat dairy products, such as skim milk or low-fat yogurt. Avoid fatty cuts of meat, processed or cured meats, and poultry with skin. Fill about one fourth of your plate with lean proteins, such as fish, chicken without skin, beans, eggs, or tofu. Avoid pre-made and processed foods. These tend to be higher in sodium, added sugar, and fat. Reduce your daily sodium intake. Many people with hypertension should eat less than 1,500 mg of sodium a day. Do not drink alcohol if: Your health care provider tells you not to drink. You are pregnant, may be pregnant, or are planning to become pregnant. If you drink alcohol: Limit how much you have to: 0-1 drink a day for women. 0-2 drinks a day for men. Know how much alcohol is in your drink. In the [place] one drink equals one 12 oz bottle of beer (355 mL), one 5 oz glass of wine (148 mL), or one 1 oz glass of hard liquor (44 mL). Lifestyle  Work with your health care provider to maintain a healthy body weight or to lose weight. Ask what an ideal weight is for you. Get at least 30 minutes of exercise that causes your heart to beat faster (aerobic exercise) most days of the week. Activities may include walking, swimming, or biking. Include exercise to strengthen your muscles (resistance exercise), such as Pilates or lifting weights, as part of your weekly exercise routine. Try to do these types of exercises for 30 minutes at least 3 days a week. Do not use any products that contain nicotine or tobacco. These products include cigarettes, chewing tobacco, and vaping devices, such as e-cigarettes. If you need help quitting, ask your health care provider. Monitor your blood pressure at home as told by your health care provider. Keep all follow-up visits. This is important. Medicines Take over-the-counter and prescription medicines only as told by your health care provider. Follow directions carefully. Blood  pressure medicines must be taken as prescribed. Do not skip doses of blood pressure medicine. Doing this puts you at risk for problems and can make the medicine less effective. Ask your health care provider about side effects or reactions to medicines that you should watch for. Contact a health care provider if you: Think you are having a reaction to a medicine you are taking. Have headaches that keep coming back (recurring). Feel dizzy. Have swelling in your ankles. Have trouble with your vision. Get help right away if you: Develop a severe headache or confusion. Have unusual weakness or numbness. Feel faint. Have severe pain in your chest or abdomen. Vomit repeatedly. Have trouble breathing. These symptoms may be an emergency. Get help right away. Call 911. Do not wait to see if the symptoms will go away. Do not drive yourself to the hospital. Summary Hypertension is when the force of blood pumping through your arteries is too strong. If this condition is not controlled, it may put you at risk for serious complications. Your personal target blood pressure may vary depending on your medical conditions, your age, and other factors. For most people, a normal blood pressure is less than 120/80. Hypertension is treated with lifestyle changes, medicines, or a combination of both. Lifestyle changes include losing weight, eating a healthy,   low-sodium diet, exercising more, and limiting alcohol. This information is not intended to replace advice given to you by your health care provider. Make sure you discuss any questions you have with your health care provider. Document Revised: 10/06/2021 Document Reviewed: 10/06/2021 Elsevier Patient Education  2023 [employer]  

## 2022-09-14 NOTE — Assessment & Plan Note (Signed)
History of pulmonary embolism.  On indefinite long-term anticoagulation.  No clinical bleeding episodes. Fall precautions given. Continue Eliquis 5 mg twice a day.

## 2022-09-14 NOTE — ED Provider Notes (Signed)
[facility] EMERGENCY DEPARTMENT Provider Note   CSN: [id number] Arrival date & time: 09/14/22  1855     History  Chief Complaint  Patient presents with   Jerome Lee is a 71 y.o. male.  The history is provided by the patient and medical records. No language interpreter was used.  Fall This is a new problem. The current episode started less than 1 hour ago. The problem occurs rarely. The problem has not changed since onset.Pertinent negatives include no chest pain, no abdominal pain, no headaches and no shortness of breath. Nothing aggravates the symptoms. Nothing relieves the symptoms. He has tried nothing for the symptoms. The treatment provided no relief.       Home Medications Prior to Admission medications   Medication Sig Start Date End Date Taking? Authorizing Provider  apixaban (ELIQUIS) 5 MG TABS tablet Take 1 tablet (5 mg total) by mouth 2 (two) times daily. 04/15/22   Hunsucker, Bonna Gains, MD  folic acid (FOLVITE) 1 MG tablet Take 1 mg by mouth daily. 12/23/18   [provider]  methotrexate (RHEUMATREX) 2.5 MG tablet Take 15 mg by mouth once a week. 01/13/19   [provider]  Multiple Vitamins-Minerals (MULTIVITAL PO) Take 1 tablet by mouth daily.    [provider]  rosuvastatin (CRESTOR) 10 MG tablet Take 1 tablet (10 mg total) by mouth daily. 09/14/22   Horald Pollen, MD  valsartan (DIOVAN) 80 MG tablet Take 1 tablet (80 mg total) by mouth daily. 09/14/22   Horald Pollen, MD      Allergies    Penicillins and Amoxicillin    Review of Systems   Review of Systems  Constitutional:  Negative for chills, diaphoresis, fatigue and fever.  [name]:  Negative for congestion.   Eyes:  Negative for visual disturbance.  Respiratory:  Negative for cough, chest tightness, shortness of breath and wheezing.   Cardiovascular:  Negative for chest pain.  Gastrointestinal:  Negative for abdominal pain,  constipation, diarrhea, nausea and vomiting.  Genitourinary:  Negative for dysuria.  Musculoskeletal:  Negative for back pain, neck pain and neck stiffness.  Skin:  Negative for rash and wound.  Neurological:  Negative for seizures, speech difficulty, weakness, light-headedness, numbness and headaches.  Psychiatric/Behavioral:  Negative for agitation and confusion.   All other systems reviewed and are negative.   Physical Exam Updated Vital Signs BP (!) 146/92 (BP Location: Right Arm)   Pulse (!) 109   Temp 98.8 F (37.1 C) (Oral)   Resp 20   SpO2 90%  Physical Exam Vitals and nursing note reviewed.  Constitutional:      General: He is not in acute distress.    Appearance: He is well-developed. He is not ill-appearing, toxic-appearing or diaphoretic.  [name]:     Head: Normocephalic and atraumatic.     Nose: No congestion or rhinorrhea.     Mouth/Throat:     Mouth: Mucous membranes are moist.  Eyes:     Extraocular Movements: Extraocular movements intact.     Conjunctiva/sclera: Conjunctivae normal.     Pupils: Pupils are equal, round, and reactive to light.  Cardiovascular:     Rate and Rhythm: Normal rate and regular rhythm.     Heart sounds: No murmur heard. Pulmonary:     Effort: Pulmonary effort is normal. No respiratory distress.     Breath sounds: Normal breath sounds. No rhonchi or rales.  Chest:     Chest  wall: No tenderness.  Abdominal:     General: Abdomen is flat.     Palpations: Abdomen is soft.     Tenderness: There is no abdominal tenderness. There is no right CVA tenderness, left CVA tenderness, guarding or rebound.  Musculoskeletal:        General: No swelling or tenderness.     Cervical back: Neck supple.     Right lower leg: No edema.     Left lower leg: No edema.  Skin:    General: Skin is warm and dry.     Capillary Refill: Capillary refill takes less than 2 seconds.     Findings: No erythema or rash.  Neurological:     General: No focal deficit  present.     Mental Status: He is alert.     Sensory: No sensory deficit.     Motor: No weakness.     Comments: Unsteady initially  Psychiatric:        Mood and Affect: Mood normal.     ED Results / Procedures / Treatments   Labs (all labs ordered are listed, but only abnormal results are displayed) Labs Reviewed  COMPREHENSIVE METABOLIC PANEL - Abnormal; Notable for the following components:      Result Value   Creatinine, Ser 1.28 (*)    All other components within normal limits  ETHANOL - Abnormal; Notable for the following components:   Alcohol, Ethyl (B) 256 (*)    All other components within normal limits  URINALYSIS, ROUTINE W REFLEX MICROSCOPIC - Abnormal; Notable for the following components:   Color, Urine COLORLESS (*)    Specific Gravity, Urine 1.002 (*)    Hgb urine dipstick SMALL (*)    All other components within normal limits  SARS CORONAVIRUS 2 BY RT PCR  CBC WITH DIFFERENTIAL/PLATELET  RAPID URINE DRUG SCREEN, HOSP PERFORMED    EKG None  Radiology CT HEAD WO CONTRAST (5MM)  Result Date: 09/14/2022 CLINICAL DATA:  Head injury after fall. EXAM: CT HEAD WITHOUT CONTRAST TECHNIQUE: Contiguous axial images were obtained from the base of the skull through the vertex without intravenous contrast. RADIATION DOSE REDUCTION: This exam was performed according to the departmental dose-optimization program which includes automated exposure control, adjustment of the mA and/or kV according to patient size and/or use of iterative reconstruction technique. COMPARISON:  January 18, 2013. FINDINGS: Brain: Mild chronic ischemic white matter disease. No mass effect or midline shift is noted. Ventricular size is within normal limits. There is no evidence of mass lesion, hemorrhage or acute infarction. Vascular: No hyperdense vessel or unexpected calcification. Skull: Normal. Negative for fracture or focal lesion. Sinuses/Orbits: Mild mucosal thickening is noted in left maxillary  sinus. Other: None. IMPRESSION: No acute intracranial abnormality seen. Electronically Signed   By: Marijo Conception M.D.   On: 09/14/2022 20:08    Procedures Procedures    Medications Ordered in ED Medications  sodium chloride 0.9 % bolus 500 mL (500 mLs Intravenous New Bag/Given 09/14/22 1946)    ED Course/ Medical Decision Making/ A&P                           Medical Decision Making Amount and/or Complexity of Data Reviewed Labs: ordered. Radiology: ordered.    Jerome Lee is a 71 y.o. male with a past medical history significant for obesity, hypertension, previous pulmonary emboli on Eliquis therapy, and dyslipidemia who presents with fall, unsteadiness, concern for head injury, and  intoxication.  According to patient, he drank a moderate amount of tequila that is more than normal for him today and while going up some stairs fell and hit his head.  He did not lose consciousness but due to the persistent unsteadiness, he was brought in for evaluation.  He is denying any neck pain, chest pain, abdominal pain, or extremity pains.  When trying to ambulate patient he continued to stumble and nearly fell again.  He is denying any nausea, vomiting, vision changes.  He is laughing and saying he is otherwise asymptomatic.  He is denying significant headache but does agree that he feels "off".  There is no documentation of previous drug in his chart.  On arrival, airway is intact.  Breath sounds are equal bilaterally.  Patient is not hypoxic and is resting comfortably.  Blood pressure is not critically abnormal.  Due to lack of any symptoms in his torso, will hold on x-rays of the ches or pelvis.  We will however get a CT scan of his head given his Eliquis use.  We will also get some screening labs to look for other causes of unsteadiness and altered mental status such as UTI or electrolyte disturbance.  We will get a UDS and an EtOH level.  If work-up is reassuring, anticipate he was likely  going to be a metabolize to freedom to make sure he is safe before discharge home.  We will give a small amount of fluids.  His lungs did not sound wet.  Oxygen saturations were in the upper 90s during my initial evaluation.  Patient had reassuring head CT and labs did show evidence of elevated EtOH as expected.  Other labs were overall reassuring.  Over several hours, patient metabolized the alcohol and was able to safely ambulate and pass a p.o. challenge.  He is feeling much better.  Family feels safe with him going home now.  Suspect intoxication led to his unsteadiness and fall and as it has improved, we feel he is safe for discharge.  Patient discharged in good condition with understanding return precautions and follow-up instructions.         Final Clinical Impression(s) / ED Diagnoses Final diagnoses:  Fall, initial encounter  Blood alcohol level of 240 mg/100 ml or more    Rx / DC Orders ED Discharge Orders     None       Clinical Impression: 1. Fall, initial encounter   2. Blood alcohol level of 240 mg/100 ml or more     Disposition: Discharge  Condition: Good  I have discussed the results, Dx and Tx plan with the pt(& family if present). He/she/they expressed understanding and agree(s) with the plan. Discharge instructions discussed at great length. Strict return precautions discussed and pt &/or family have verbalized understanding of the instructions. No further questions at time of discharge.    New Prescriptions   No medications on file    Follow Up: Horald Pollen, MD [place] [address] [phone]     [facility] EMERGENCY DEPARTMENT [address] [id number] mc [place]       Mckinsley Koelzer, Gwenyth Allegra, MD 09/14/22 2351
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
## 2022-09-14 NOTE — Assessment & Plan Note (Signed)
Well-controlled hypertension. Continue valsartan 80 mg daily. BP Readings from Last 3 Encounters:  09/14/22 138/84  07/14/22 122/72  04/26/22 132/70

## 2022-09-14 NOTE — Assessment & Plan Note (Signed)
Stable.  Diet and nutrition discussed. Should take rosuvastatin 10 mg daily.

## 2022-09-14 NOTE — Assessment & Plan Note (Signed)
Well-controlled. Continue methotrexate 2.5 mg weekly.

## 2022-09-14 NOTE — ED Notes (Signed)
Pt able to ambulate without getting lightheaded or dizzy. Pt has a limp from previous surgery that family states looks like his baseline.   Pt provided water and snacks for PO challenge

## 2022-09-14 NOTE — ED Triage Notes (Addendum)
Patient lost his balance and fell at home , denies head injury/no LOC , alert and oriented , denies pain , respirations unlabored . He is taking Eliquis . + ETOH.
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
## 2022-09-14 NOTE — Discharge Instructions (Signed)
Your history, exam, work-up today revealed alcohol intoxication likely leading to your fall.  Given your blood thinner use, we did imaging of your head that was reassuring.  Your labs did show some mild dehydration and elevated alcohol level.  As you were able to metabolize this and are now safe for discharge home, please rest and stay hydrated and be safe.  Please follow-up with your primary doctor.  If any symptoms change or worsen acutely, please return to the nearest emergency department.

## 2022-10-07 IMAGING — CT CT ANGIO CHEST
3 of 7 series · 18 of 36 positions shown · IV contrast (omnipaque)
Comparison: Chest CT November 28, 2016. Chest x-ray July 26, 2021.

CLINICAL DATA: High probability for pulmonary embolus. Shortness of
breath.

EXAM:
CT ANGIOGRAPHY CHEST WITH CONTRAST
TECHNIQUE: Multidetector CT imaging of the chest was performed using the
standard protocol during bolus administration of intravenous
contrast. Multiplanar CT image reconstructions and MIPs were
obtained to evaluate the vascular anatomy.
CONTRAST:  75mL OMNIPAQUE IOHEXOL 350 MG/ML SOLN

[Series 7: pe lung · axial · 0.64mm/px · z∈[+1260,+1328]mm · 2 of 135 slices shown]
[im 34/135  mediastinal]
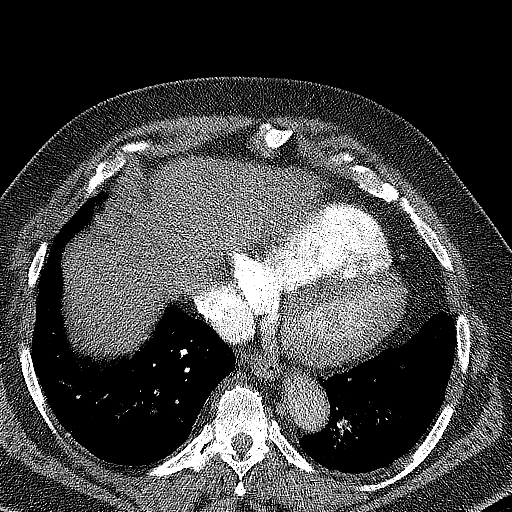
[im 68/135  mediastinal]
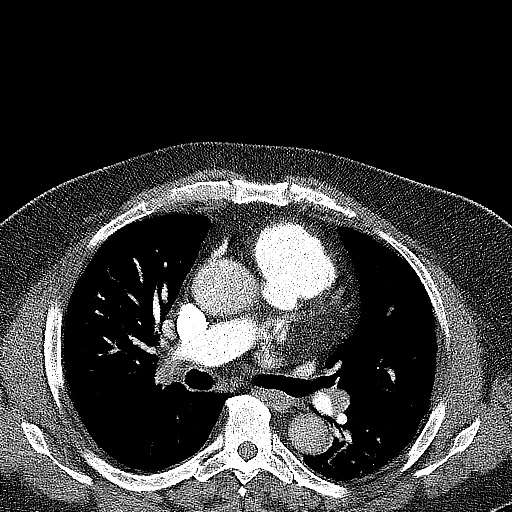

[Series 8: pe thins · axial · 0.70mm/px · z∈[+1170,+1442]mm · 15 of 445 slices shown]
[im 28/445  lung]
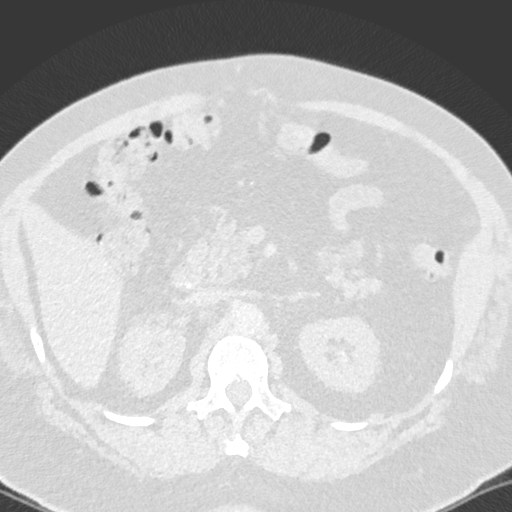
[im 56/445  mediastinal]
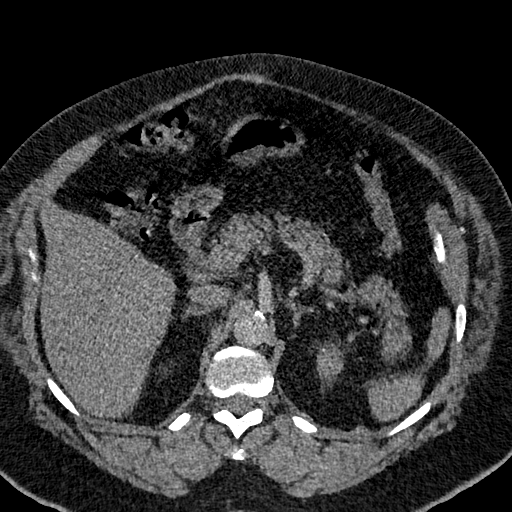
[im 84/445  lung]
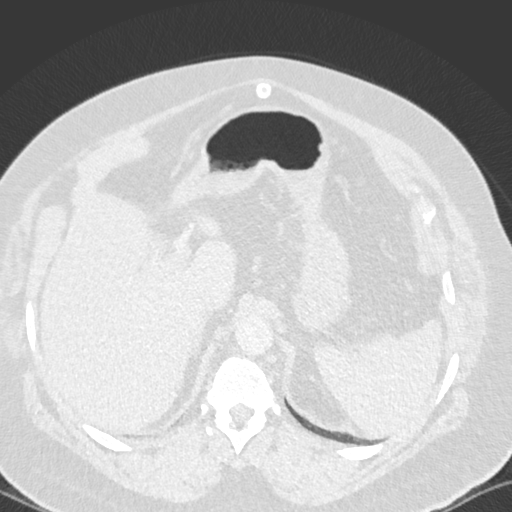
[im 112/445  mediastinal]
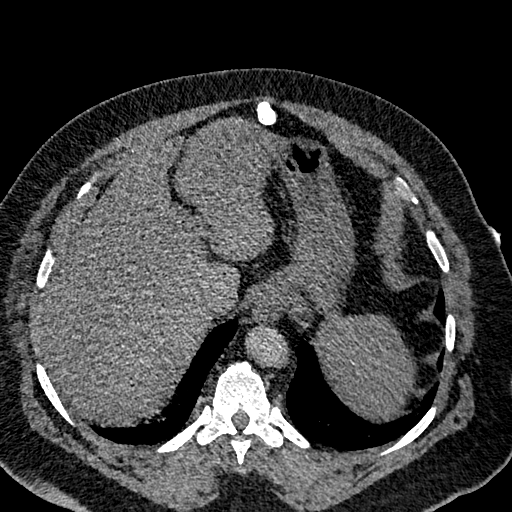
[im 139/445  lung]
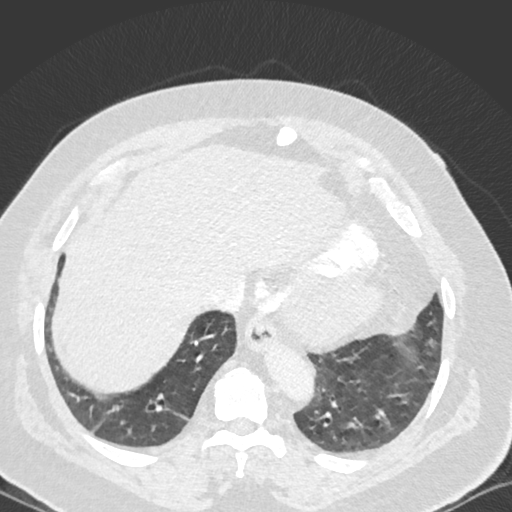
[im 167/445  mediastinal]
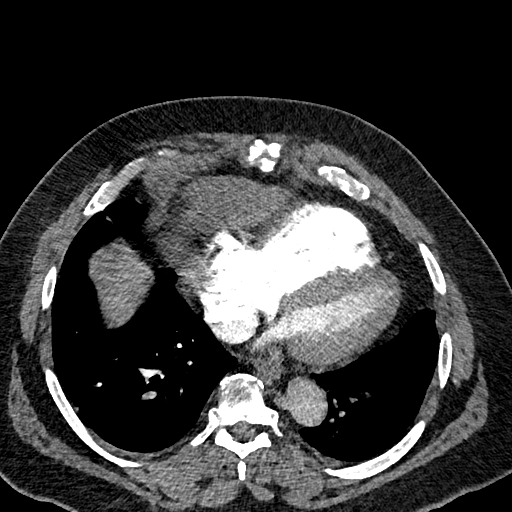
[im 195/445  lung]
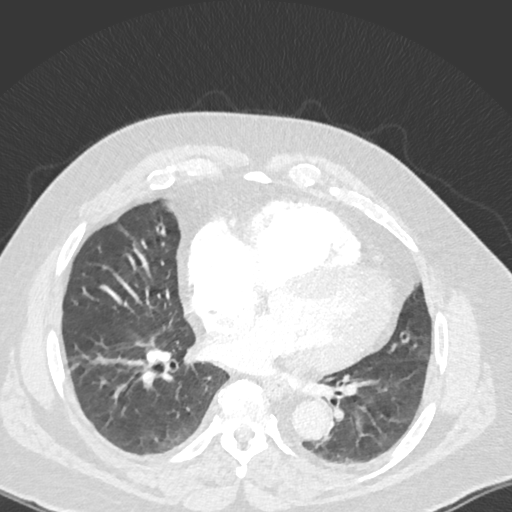
[im 223/445  mediastinal]
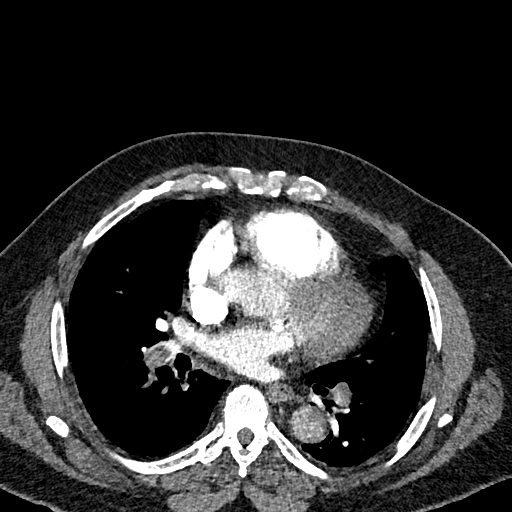
[im 250/445  lung]
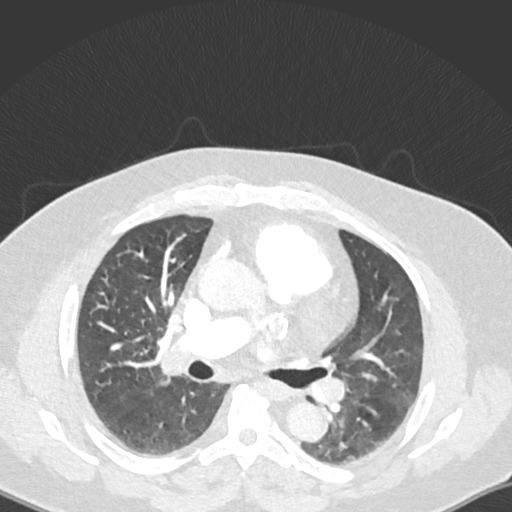
[im 278/445  mediastinal]
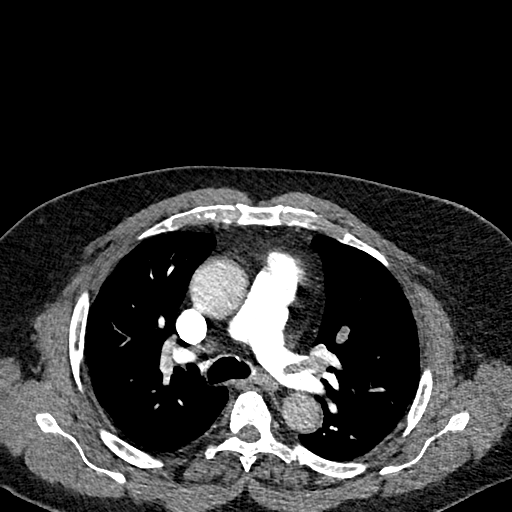
[im 306/445  lung]
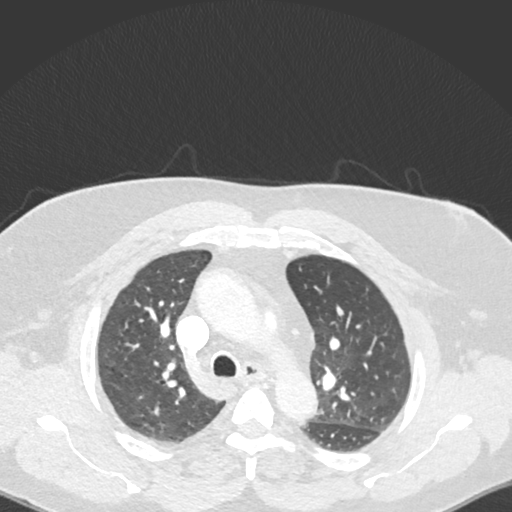
[im 334/445  mediastinal]
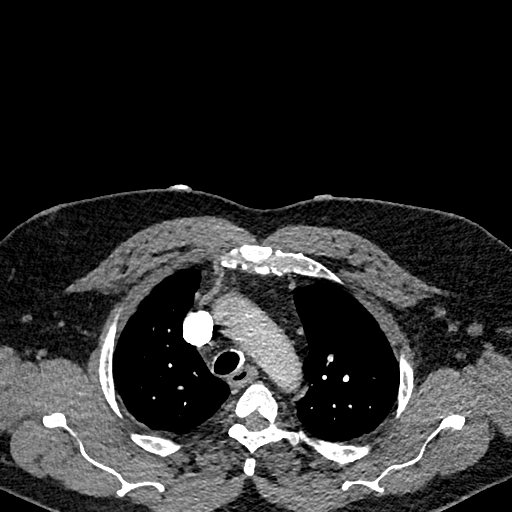
[im 361/445  lung]
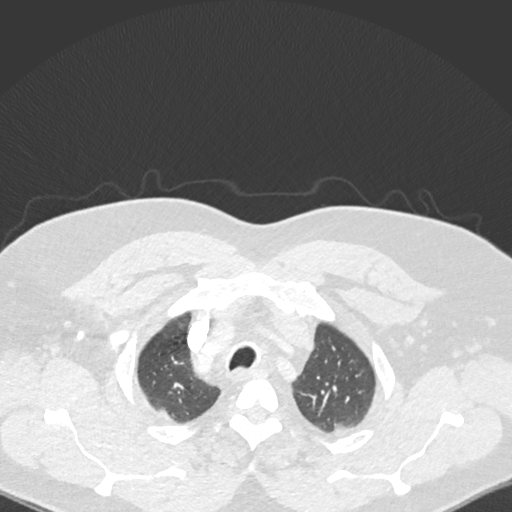
[im 389/445  mediastinal]
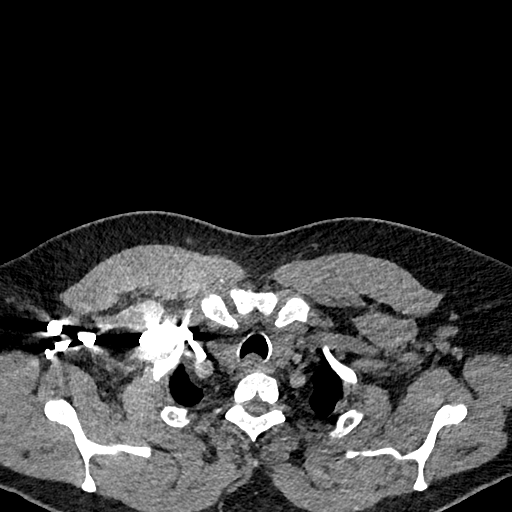
[im 417/445  lung]
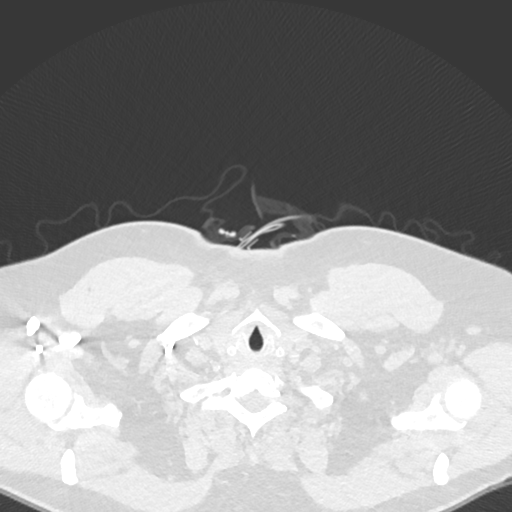

[Series 9: pe 2mm cor · coronal · 0.61mm/px · 1 of 117 slices shown]
[im 59/117  mediastinal]
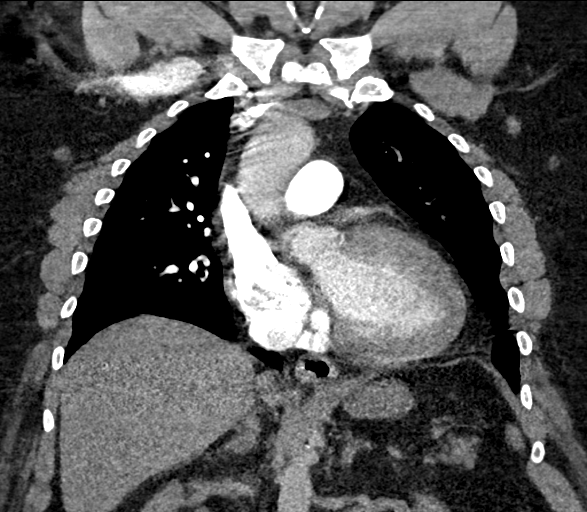

[18 of 36 positions shown; findings below may reference images not displayed]

FINDINGS: Cardiovascular: The thoracic aorta is nonaneurysmal without
identified dissection. Minimal atherosclerotic change noted. The
heart size is borderline to mildly enlarged. Bilateral pulmonary
emboli are identified. On the left, embolus extends from the distal
main pulmonary artery into both upper and lower lobe branches. At
least 3 upper lobe segments on the left and 4 lower lobe segments on
the left are involved. Embolus extends from the distal main right
pulmonary artery in the proximal upper lobe branches and multiple
lower lobe segmental branches. The main pulmonary artery is normal
in caliber. The right ventricular diameter is 5 cm and the left
ventricular diameter is 3.4 cm with an RV/LV ratio of 1.5.

Mediastinum/Nodes: No enlarged mediastinal, hilar, or axillary lymph
nodes. Thyroid gland, trachea, and esophagus demonstrate no
significant findings.

Lungs/Pleura: Central airways are normal. No pneumothorax. Scattered
subsegmental atelectasis. No suspicious infiltrates. No suspicious
nodules or masses.

Upper Abdomen: No acute abnormality.

Musculoskeletal: No chest wall abnormality. No acute or significant
osseous findings.

Review of the MIP images confirms the above findings.
IMPRESSION: 1. There is a large pulmonary embolus burden affecting the distal
aspects of both main pulmonary arteries. Segmental branches in the
bilateral upper lobes and bilateral lower lobes are involved. The
RV/LV ratio is 1.5. Positive for acute PE with CT evidence of right
heart strain (RV/LV Ratio = 1.5) consistent with at least submassive
(intermediate risk) PE. The presence of right heart strain has been
associated with an increased risk of morbidity and mortality. Please
refer to the "PE Focused" order set in [REDACTED].
2. Minimal atherosclerotic change in the nonaneurysmal thoracic
aorta.
3. No other acute abnormalities.

Findings called to the patient's PA, Fransisca Schweitzer.

Aortic Atherosclerosis (CXPS7-S34.4).

## 2022-10-07 IMAGING — CR DG CHEST 2V
2 series · 2 of 2 positions shown · non-contrast
Comparison: November 27, 2016

CLINICAL DATA: Shortness of breath.

EXAM:
CHEST - 2 VIEW

[chest pa]
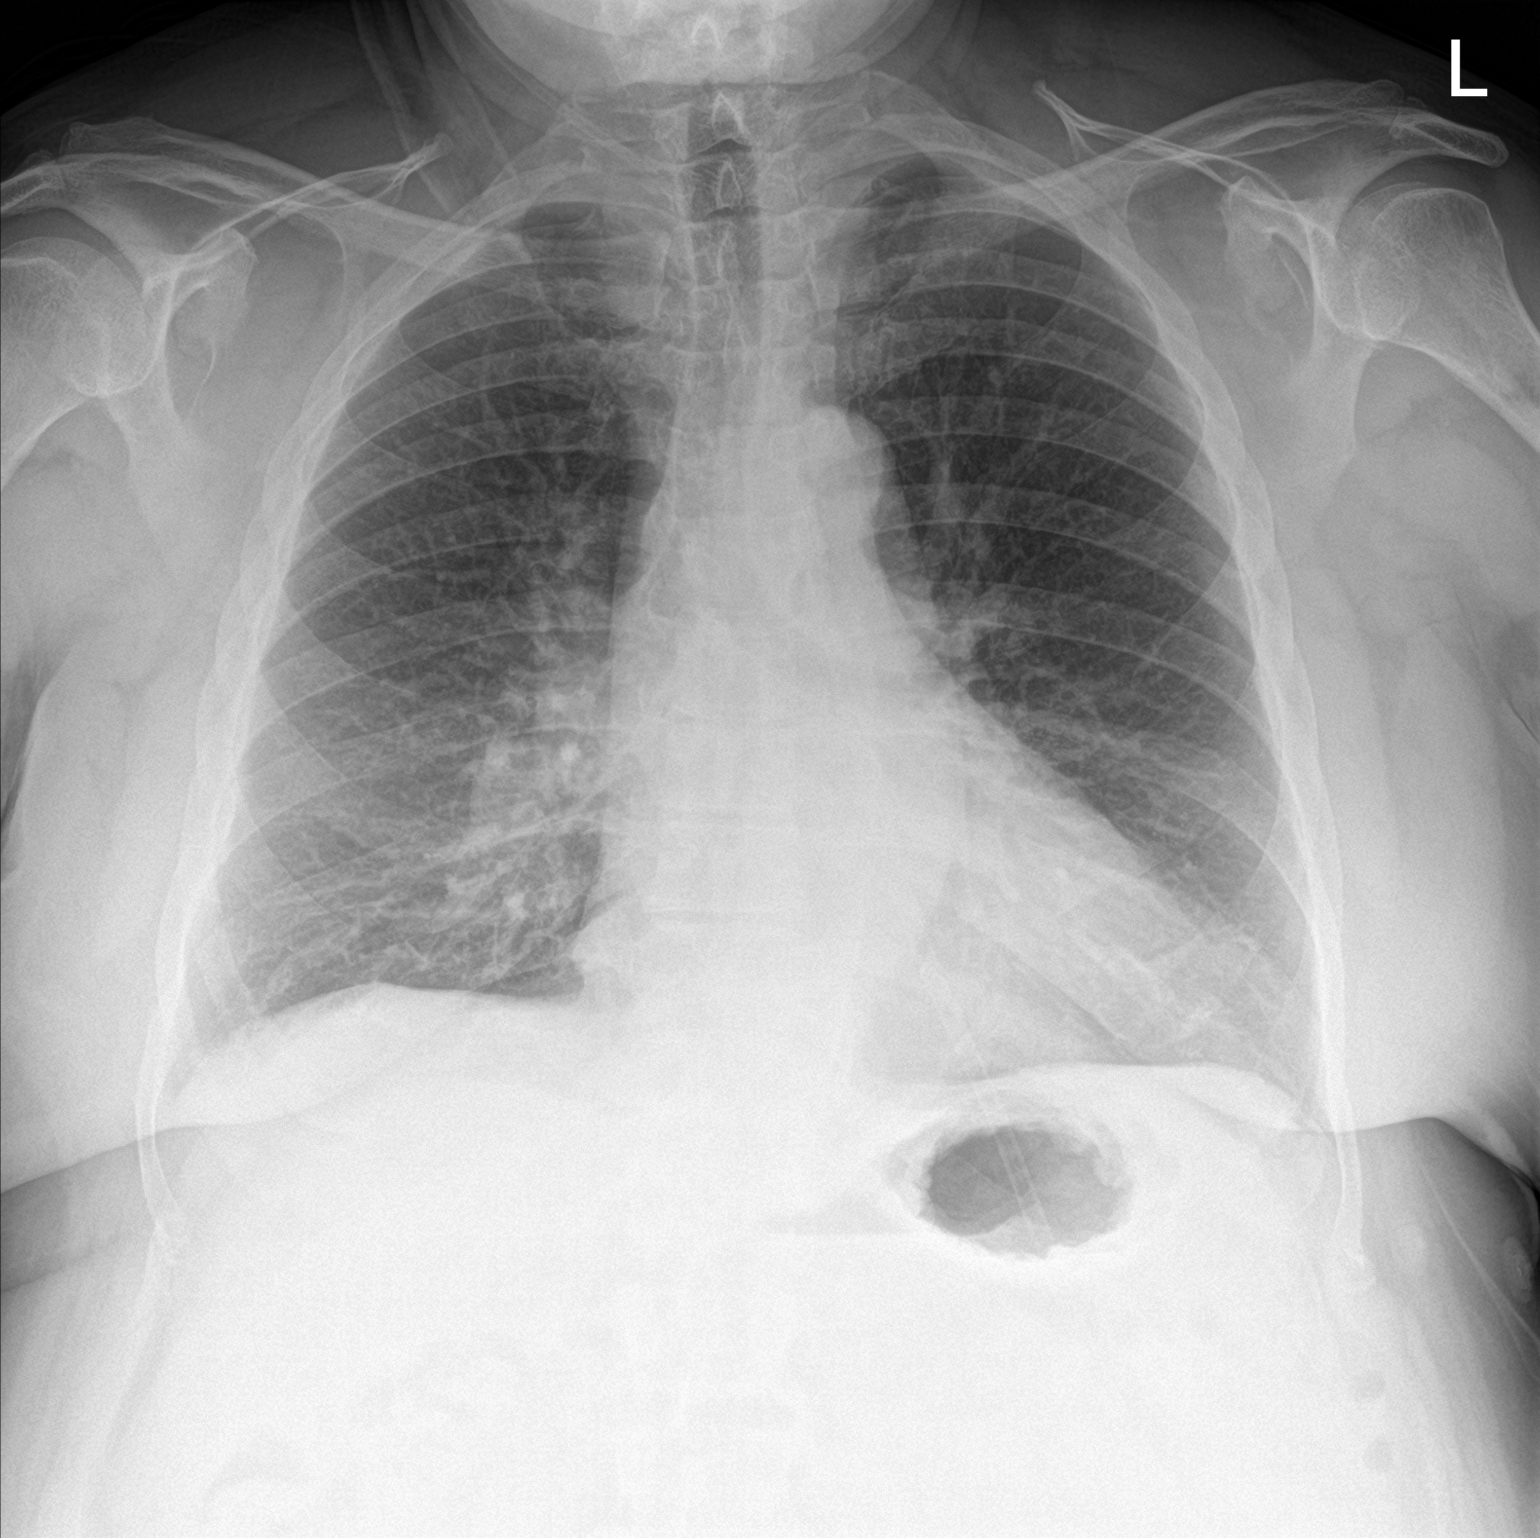

[chest lat]
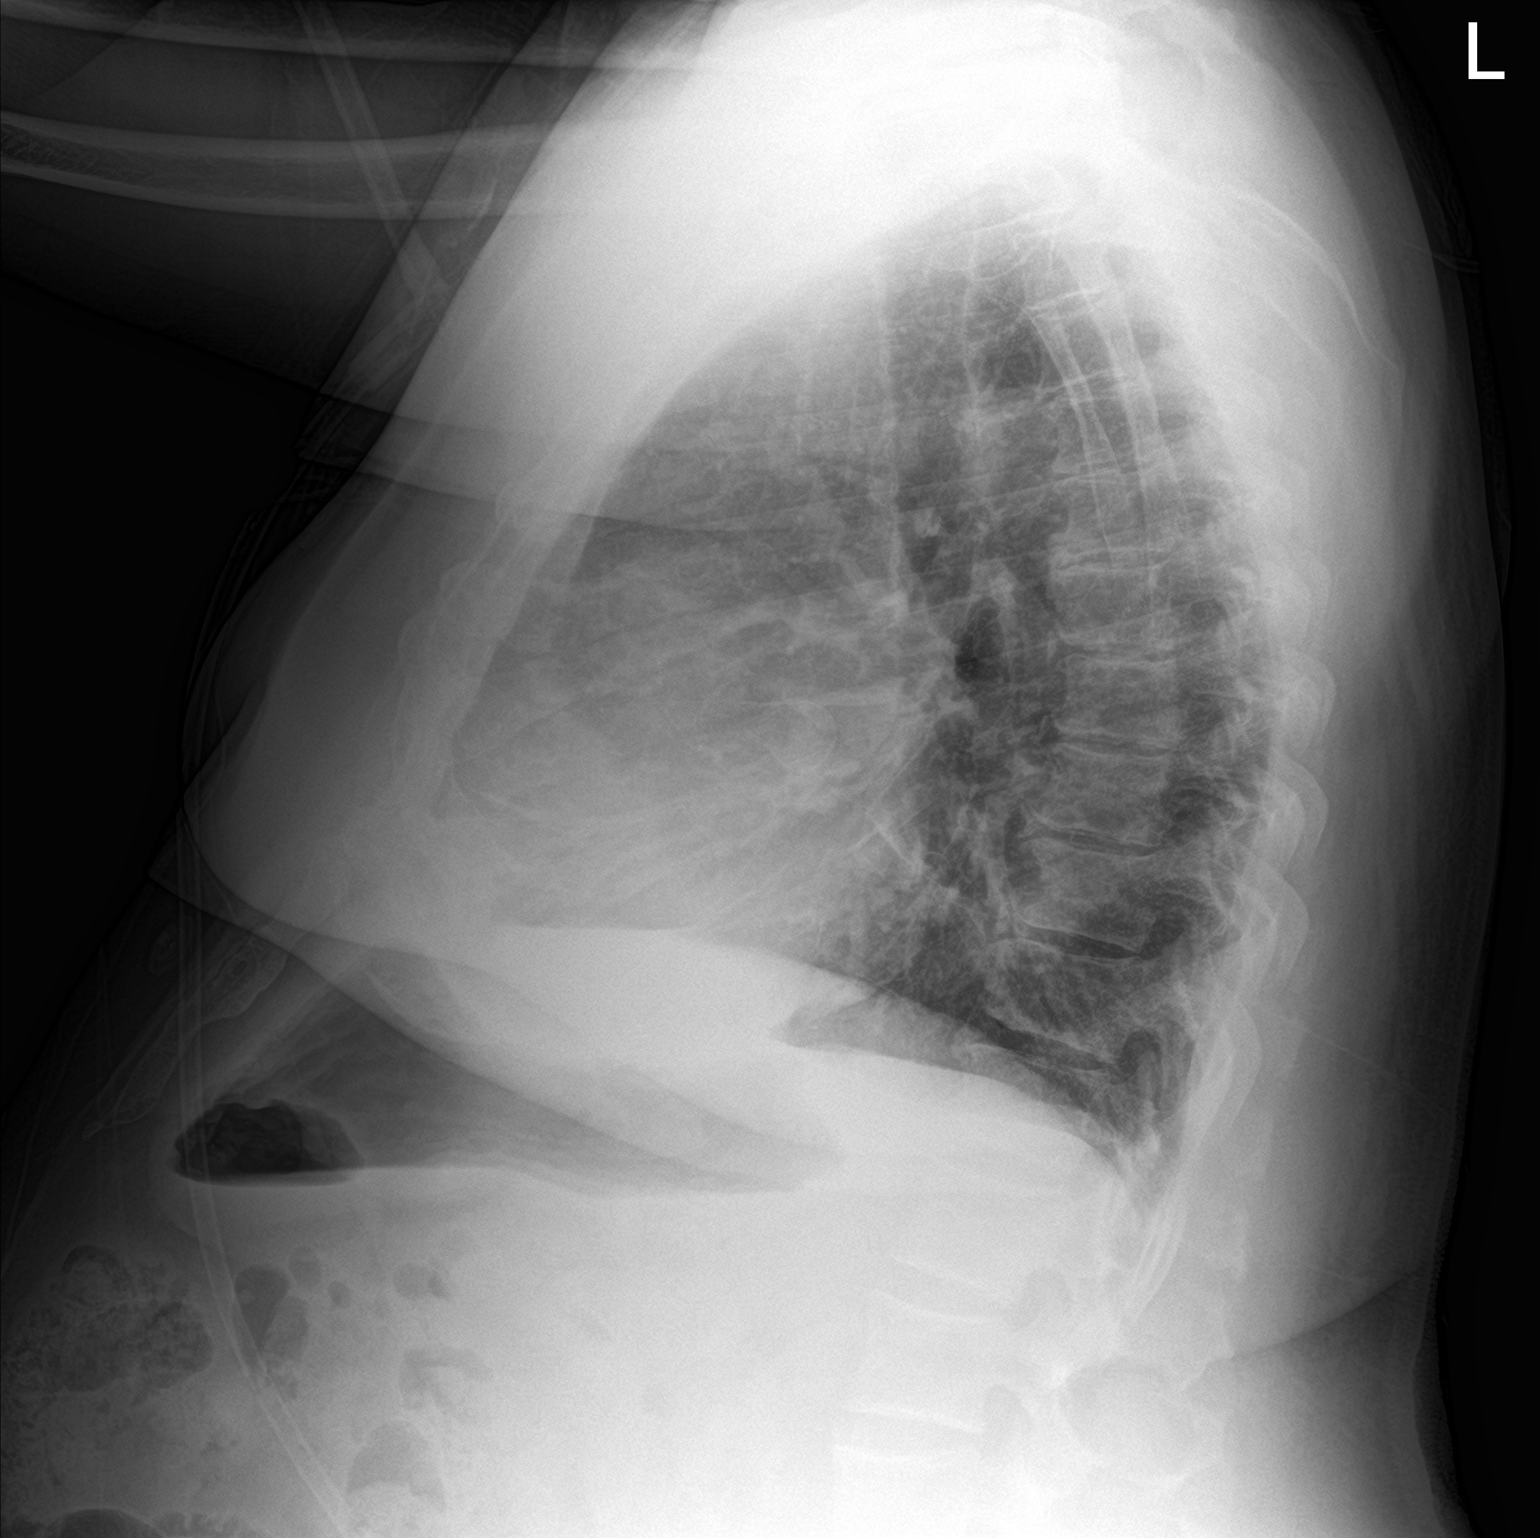

[2 of 2 positions shown; findings below may reference images not displayed]

FINDINGS: The heart size and mediastinal contours are within normal limits.
Both lungs are clear. The visualized skeletal structures are
unremarkable.
IMPRESSION: No active cardiopulmonary disease.

## 2022-10-18 DIAGNOSIS — M25569 Pain in unspecified knee: Secondary | ICD-10-CM | POA: Diagnosis not present

## 2022-10-18 DIAGNOSIS — M1990 Unspecified osteoarthritis, unspecified site: Secondary | ICD-10-CM | POA: Diagnosis not present

## 2022-10-18 DIAGNOSIS — M0609 Rheumatoid arthritis without rheumatoid factor, multiple sites: Secondary | ICD-10-CM | POA: Diagnosis not present

## 2022-10-18 DIAGNOSIS — Z79899 Other long term (current) drug therapy: Secondary | ICD-10-CM | POA: Diagnosis not present

## 2022-10-18 DIAGNOSIS — R768 Other specified abnormal immunological findings in serum: Secondary | ICD-10-CM | POA: Diagnosis not present

## 2022-10-18 DIAGNOSIS — M8580 Other specified disorders of bone density and structure, unspecified site: Secondary | ICD-10-CM | POA: Diagnosis not present

## 2022-11-11 ENCOUNTER — Ambulatory Visit: Payer: Medicare PPO

## 2022-12-25 DIAGNOSIS — Z9181 History of falling: Secondary | ICD-10-CM | POA: Diagnosis not present

## 2022-12-25 DIAGNOSIS — D84821 Immunodeficiency due to drugs: Secondary | ICD-10-CM | POA: Diagnosis not present

## 2022-12-25 DIAGNOSIS — Z7901 Long term (current) use of anticoagulants: Secondary | ICD-10-CM | POA: Diagnosis not present

## 2022-12-25 DIAGNOSIS — Z880 Allergy status to penicillin: Secondary | ICD-10-CM | POA: Diagnosis not present

## 2022-12-25 DIAGNOSIS — I10 Essential (primary) hypertension: Secondary | ICD-10-CM | POA: Diagnosis not present

## 2022-12-25 DIAGNOSIS — E785 Hyperlipidemia, unspecified: Secondary | ICD-10-CM | POA: Diagnosis not present

## 2022-12-25 DIAGNOSIS — M069 Rheumatoid arthritis, unspecified: Secondary | ICD-10-CM | POA: Diagnosis not present

## 2022-12-25 DIAGNOSIS — Z803 Family history of malignant neoplasm of breast: Secondary | ICD-10-CM | POA: Diagnosis not present

## 2022-12-25 DIAGNOSIS — M8580 Other specified disorders of bone density and structure, unspecified site: Secondary | ICD-10-CM | POA: Diagnosis not present

## 2023-01-18 DIAGNOSIS — M25569 Pain in unspecified knee: Secondary | ICD-10-CM | POA: Diagnosis not present

## 2023-01-18 DIAGNOSIS — M8580 Other specified disorders of bone density and structure, unspecified site: Secondary | ICD-10-CM | POA: Diagnosis not present

## 2023-01-18 DIAGNOSIS — Z79899 Other long term (current) drug therapy: Secondary | ICD-10-CM | POA: Diagnosis not present

## 2023-01-18 DIAGNOSIS — R768 Other specified abnormal immunological findings in serum: Secondary | ICD-10-CM | POA: Diagnosis not present

## 2023-01-18 DIAGNOSIS — M1990 Unspecified osteoarthritis, unspecified site: Secondary | ICD-10-CM | POA: Diagnosis not present

## 2023-01-18 DIAGNOSIS — M0609 Rheumatoid arthritis without rheumatoid factor, multiple sites: Secondary | ICD-10-CM | POA: Diagnosis not present

## 2023-02-07 ENCOUNTER — Encounter: Payer: Self-pay | Admitting: Emergency Medicine

## 2023-02-07 ENCOUNTER — Ambulatory Visit: Payer: Medicare PPO | Admitting: Emergency Medicine

## 2023-02-07 VITALS — BP 130/76 | HR 86 | Temp 98.4°F | Ht 65.0 in | Wt 235.0 lb

## 2023-02-07 DIAGNOSIS — J069 Acute upper respiratory infection, unspecified: Secondary | ICD-10-CM | POA: Insufficient documentation

## 2023-02-07 DIAGNOSIS — R053 Chronic cough: Secondary | ICD-10-CM | POA: Insufficient documentation

## 2023-02-07 NOTE — Assessment & Plan Note (Signed)
Running its course without complications. Clinically stable. Advised to continue over-the-counter DayQuil and NyQuil. Advised to rest and stay well-hydrated. Advised to contact the office if no better or worse during the next several days

## 2023-02-07 NOTE — Assessment & Plan Note (Signed)
Continue DayQuil and NyQuil as needed Cough management discussed Continue over-the-counter cough drops Advised to rest and stay well-hydrated
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
## 2023-02-07 NOTE — Patient Instructions (Signed)

## 2023-02-19 ENCOUNTER — Other Ambulatory Visit: Payer: Self-pay

## 2023-02-19 ENCOUNTER — Encounter (HOSPITAL_COMMUNITY): Payer: Self-pay

## 2023-02-19 ENCOUNTER — Emergency Department (HOSPITAL_COMMUNITY)
Admission: EM | Admit: 2023-02-19 | Discharge: 2023-02-19 | Disposition: A | Discharge status: Home / Self Care | Payer: Medicare PPO | Attending: Emergency Medicine | Admitting: Emergency Medicine

## 2023-02-19 ENCOUNTER — Emergency Department (HOSPITAL_COMMUNITY): Payer: Medicare PPO

## 2023-02-19 DIAGNOSIS — I10 Essential (primary) hypertension: Secondary | ICD-10-CM | POA: Diagnosis not present

## 2023-02-19 DIAGNOSIS — Z20822 Contact with and (suspected) exposure to covid-19: Secondary | ICD-10-CM | POA: Insufficient documentation

## 2023-02-19 DIAGNOSIS — Z7901 Long term (current) use of anticoagulants: Secondary | ICD-10-CM | POA: Insufficient documentation

## 2023-02-19 DIAGNOSIS — R0602 Shortness of breath: Secondary | ICD-10-CM | POA: Diagnosis not present

## 2023-02-19 DIAGNOSIS — Z79899 Other long term (current) drug therapy: Secondary | ICD-10-CM | POA: Insufficient documentation

## 2023-02-19 DIAGNOSIS — J189 Pneumonia, unspecified organism: Secondary | ICD-10-CM | POA: Diagnosis not present

## 2023-02-19 DIAGNOSIS — R059 Cough, unspecified: Secondary | ICD-10-CM | POA: Diagnosis not present

## 2023-02-19 DIAGNOSIS — R918 Other nonspecific abnormal finding of lung field: Secondary | ICD-10-CM | POA: Diagnosis not present

## 2023-02-19 LAB — BASIC METABOLIC PANEL
Anion gap: 8 (ref 5–15)
BUN: 10 mg/dL (ref 8–23)
CO2: 23 mmol/L (ref 22–32)
Calcium: 8.8 mg/dL — ABNORMAL LOW (ref 8.9–10.3)
Chloride: 104 mmol/L (ref 98–111)
Creatinine, Ser: 1.19 mg/dL (ref 0.61–1.24)
GFR, Estimated: >60 mL/min (ref >60)
Glucose, Bld: 100 mg/dL — ABNORMAL HIGH (ref 70–99)
Potassium: 3.7 mmol/L (ref 3.5–5.1)
Sodium: 135 mmol/L (ref 135–145)

## 2023-02-19 LAB — RESP PANEL BY RT-PCR (RSV, FLU A&B, COVID)  RVPGX2
Influenza A by PCR: NEGATIVE
Influenza B by PCR: NEGATIVE
Resp Syncytial Virus by PCR: NEGATIVE
SARS Coronavirus 2 by RT PCR: NEGATIVE

## 2023-02-19 LAB — URINALYSIS, ROUTINE W REFLEX MICROSCOPIC
Bacteria, UA: NONE SEEN
Bilirubin Urine: NEGATIVE
Glucose, UA: NEGATIVE mg/dL
Hgb urine dipstick: NEGATIVE
Ketones, ur: NEGATIVE mg/dL
Leukocytes,Ua: NEGATIVE
Nitrite: NEGATIVE
Protein, ur: 100 mg/dL — AB
Specific Gravity, Urine: 1.013 (ref 1.005–1.030)
pH: 7 (ref 5.0–8.0)

## 2023-02-19 LAB — CBC
HCT: 43.6 % (ref 39.0–52.0)
Hemoglobin: 14.3 g/dL (ref 13.0–17.0)
MCH: 29.2 pg (ref 26.0–34.0)
MCHC: 32.8 g/dL (ref 30.0–36.0)
MCV: 89.2 fL (ref 80.0–100.0)
Platelets: 202 10*3/uL (ref 150–400)
RBC: 4.89 MIL/uL (ref 4.22–5.81)
RDW: 14.4 % (ref 11.5–15.5)
WBC: 14.3 10*3/uL — ABNORMAL HIGH (ref 4.0–10.5)
nRBC: 0 % (ref 0.0–0.2)

## 2023-02-19 LAB — CBG MONITORING, ED: Glucose-Capillary: 93 mg/dL (ref 70–99)

## 2023-02-19 MED ORDER — DOXYCYCLINE HYCLATE 100 MG PO CAPS: 100.0000 mg | ORAL_CAPSULE | Freq: Two times a day (BID) | ORAL | 0 refills | Status: AC

## 2023-02-19 NOTE — Discharge Instructions (Signed)
You were seen in the emergency department today for cough and fever.  You do have pneumonia.  We are placing you on doxycycline which you will take twice a day over the next week.  Please return to the emergency department if you have worsening shortness of breath or difficulty breathing with fever.  Please drink plenty of fluids and rest and eat a gentle diet.

## 2023-02-19 NOTE — ED Triage Notes (Addendum)
Pt c/o chills that began yesterday afternoon; denies known fevers; states he becomes dizzy upon standing, subsides once sitting; denies pain; endorses productive cough

## 2023-02-19 NOTE — ED Notes (Signed)
AVS reviewed with pt and family. Pt verbalizes understanding. Belongings with pt upon depart. Ambulatory to POV with family.

## 2023-02-19 NOTE — ED Notes (Signed)
Pt O2 sats held from 98 to 100 percent while ambulating

## 2023-02-19 NOTE — ED Provider Notes (Signed)
[facility] Provider Note   CSN: [id number] Arrival date & time: 02/19/23  [id number]     History  Chief Complaint  Patient presents with   Cough    Jerome Lee is a 72 y.o. male.  With past medical history of obesity, hypertension, arthritis, PE anticoagulated on Eliquis who presents to the emergency department with cough and fever.  Patient states that cough began about 2 weeks ago.  He states that he initially visited his primary care provider who told him that it was viral and needed to run its course.  He states that after that he began taking TheraFlu day and night and felt like it was improving.  He states that yesterday he was in the backyard talking with a friend when he began having chills.  He states that throughout the night he began having alternating chills and hot flashes.  He never objectively took a temp but felt febrile.  States that he is having ongoing more productive cough and was concerned about having pneumonia.  He states that this morning he got up to use restroom and felt lightheaded getting to the bathroom.  He has been eating and drinking appropriately.  He denies having any chest pain, shortness of breath, abdominal pain, nausea, vomiting, diarrhea or dysuria.  HPI     Home Medications Prior to Admission medications   Medication Sig Start Date End Date Taking? Authorizing Provider  doxycycline (VIBRAMYCIN) 100 MG capsule Take 1 capsule (100 mg total) by mouth 2 (two) times daily for 7 days. 02/19/23 02/26/23 Yes Mickie Hillier, PA-C  apixaban (ELIQUIS) 5 MG TABS tablet Take 1 tablet (5 mg total) by mouth 2 (two) times daily. 04/15/22   Hunsucker, Bonna Gains, MD  folic acid (FOLVITE) 1 MG tablet Take 1 mg by mouth daily. 12/23/18   [provider]  methotrexate (RHEUMATREX) 2.5 MG tablet Take 15 mg by mouth once a week. 01/13/19   [provider]  Multiple Vitamins-Minerals (MULTIVITAL PO) Take 1 tablet by  mouth daily.    [provider]  rosuvastatin (CRESTOR) 10 MG tablet Take 1 tablet (10 mg total) by mouth daily. 09/14/22   Horald Pollen, MD  valsartan (DIOVAN) 80 MG tablet Take 1 tablet (80 mg total) by mouth daily. 09/14/22   Horald Pollen, MD      Allergies    Penicillins and Amoxicillin    Review of Systems   Review of Systems  Constitutional:  Positive for chills.  Respiratory:  Positive for cough.   Neurological:  Positive for light-headedness.  All other systems reviewed and are negative.   Physical Exam Updated Vital Signs BP 136/70 (BP Location: Right Arm)   Pulse 75   Temp 98.2 F (36.8 C)   Resp 18   Ht '5\' 5"'$  (1.651 m)   Wt 105.2 kg   SpO2 97%   BMI 38.61 kg/m  Physical Exam Vitals and nursing note reviewed.  Constitutional:      General: He is not in acute distress.    Appearance: Normal appearance. He is obese. He is not ill-appearing or toxic-appearing.  [name]:     Head: Normocephalic and atraumatic.     Mouth/Throat:     Mouth: Mucous membranes are moist.     Pharynx: Oropharynx is clear. No posterior oropharyngeal erythema.  Eyes:     General: No scleral icterus.    Extraocular Movements: Extraocular movements intact.  Cardiovascular:  Rate and Rhythm: Normal rate and regular rhythm.     Pulses: Normal pulses.     Heart sounds: No murmur heard. Pulmonary:     Effort: Pulmonary effort is normal. No respiratory distress.     Breath sounds: Normal breath sounds.  Skin:    General: Skin is warm and dry.     Findings: No rash.  Neurological:     General: No focal deficit present.     Mental Status: He is alert and oriented to person, place, and time. Mental status is at baseline.  Psychiatric:        Mood and Affect: Mood normal.        Behavior: Behavior normal.        Thought Content: Thought content normal.        Judgment: Judgment normal.     ED Results / Procedures / Treatments   Labs (all labs ordered are  listed, but only abnormal results are displayed) Labs Reviewed  CBC - Abnormal; Notable for the following components:      Result Value   WBC 14.3 (*)    All other components within normal limits  BASIC METABOLIC PANEL - Abnormal; Notable for the following components:   Glucose, Bld 100 (*)    Calcium 8.8 (*)    All other components within normal limits  URINALYSIS, ROUTINE W REFLEX MICROSCOPIC - Abnormal; Notable for the following components:   Protein, ur 100 (*)    All other components within normal limits  RESP PANEL BY RT-PCR (RSV, FLU A&B, COVID)  RVPGX2  CBG MONITORING, ED    EKG EKG Interpretation  Date/Time:  Saturday February 19 2023 08:31:09 EST Ventricular Rate:  91 PR Interval:  178 QRS Duration: 88 QT Interval:  352 QTC Calculation: 432 R Axis:   -25 Text Interpretation: Normal sinus rhythm Low voltage QRS Possible Inferior infarct , age undetermined Cannot rule out Anterior infarct , age undetermined Abnormal ECG When compared with ECG of 26-Jul-2021 10:25, PREVIOUS ECG IS PRESENT Confirmed by Dene Gentry [phone]) on 02/19/2023 9:15:45 AM  Radiology DG Chest Port 1 View  Result Date: 02/19/2023 CLINICAL DATA:  72 year old male with history of cough and shortness of breath. EXAM: PORTABLE CHEST 1 VIEW COMPARISON:  Chest x-ray 07/26/2021. FINDINGS: Lung volumes are normal. Widespread interstitial prominence, patchy peribronchial cuffing and ill-defined opacity in the medial aspect of the right lung base partially obscuring the right heart border. No pleural effusions. No pneumothorax. No pulmonary nodule or mass noted. Pulmonary vasculature and the cardiomediastinal silhouette are within normal limits. IMPRESSION: 1. Findings are concerning for bronchitis and probable developing right middle lobe bronchopneumonia. Followup PA and lateral chest X-ray is recommended in 3-4 weeks following trial of antibiotic therapy to ensure resolution and exclude underlying malignancy.  Electronically Signed   By: Vinnie Langton M.D.   On: 02/19/2023 09:05    Procedures Procedures   Medications Ordered in ED Medications - No data to display  ED Course/ Medical Decision Making/ A&P    Medical Decision Making Amount and/or Complexity of Data Reviewed Labs: ordered. Radiology: ordered.  Risk Prescription drug management.  Initial Impression and Ddx 72 year old male who presents to the emergency department with cough and chills yesterday. Patient PMH that increases complexity of ED encounter: PE, hypertension, obesity  Interpretation of Diagnostics I independent reviewed and interpreted the labs as followed: Respiratory panel negative, mild leukocytosis to 14  - I independently visualized the following imaging with scope of interpretation limited to  determining acute life threatening conditions related to emergency care: Chest x-ray, which revealed right middle lobe bronchopneumonia  Patient Reassessment and Ultimate Disposition/Management Initial presentation, overall well-appearing, nonseptic and nontoxic appearing male.  He is oxygenating well on room air and is afebrile.  Not objectively tachypneic.  His lung sounds are clear bilaterally.  Appears to be euvolemic. Will obtain basic labs, chest x-ray, respiratory panel, orthostatic vital signs.  Initial white count 14 No electrolyte dysfunction or AKI UA without UTI Respiratory panel negative Chest x-ray does demonstrate a right middle lobe bronchopneumonia. I ambulated him while monitoring O2 sat and this was negative.  He tolerated 98-100% O2 sats without difficulty.  He did complain of some dizziness on arrival and was mildly dizzy after walking.  His orthostatic vital signs are negative.  He has a nonfocal neurological exam.  Likely just related to his acute illness.  I have encouraged him to drink plenty of fluids, eat a gentle diet He is not short of breath or having palpitations, tachycardia or hypoxia.   Considered and doubt PE.  No chest pain, EKG is unremarkable.  Not fluid volume overloaded and doubt that his cough is related to some sort of CHF exacerbation. I will place him on doxycycline given his penicillin allergies.  Will have follow-up with PCP.  Given strict return precautions for worsening symptoms.  Otherwise he is overall very well-appearing and feel that he is appropriate for discharge at this time.  The patient has been appropriately medically screened and/or stabilized in the ED. I have low suspicion for any other emergent medical condition which would require further screening, evaluation or treatment in the ED or require inpatient management. At time of discharge the patient is hemodynamically stable and in no acute distress. I have discussed work-up results and diagnosis with patient and answered all questions. Patient is agreeable with discharge plan. We discussed strict return precautions for returning to the emergency department and they verbalized understanding.    Patient management required discussion with the following services or consulting groups:  None  Complexity of Problems Addressed Acute complicated illness or Injury  Additional Data Reviewed and Analyzed Further history obtained from: Further history from spouse/family member, Past medical history and medications listed in the EMR, and Care Everywhere  Patient Encounter Risk Assessment Prescriptions  Final Clinical Impression(s) / ED Diagnoses Final diagnoses:  Community acquired pneumonia of right lung, unspecified part of lung    Rx / DC Orders ED Discharge Orders          Ordered    doxycycline (VIBRAMYCIN) 100 MG capsule  2 times daily        02/19/23 1032              Mickie Hillier, PA-C 02/19/23 1035    Valarie Merino, MD 02/20/23 1624

## 2023-02-25 ENCOUNTER — Telehealth: Payer: Self-pay

## 2023-02-25 NOTE — Telephone Encounter (Signed)
     Patient  visit on 02/19/2023  at [facility] was for cough, pnemonia right lung.  Have you been able to follow up with your primary care physician? Patient has appointment 03/16/2023.  The patient was or was not able to obtain any needed medicine or equipment. Patient was able to obtain medication.  Are there diet recommendations that you are having difficulty following? No  Patient expresses understanding of discharge instructions and education provided has no other needs at this time. Yes   [place] Resource Care Guide   [email or web address]  ?? [id number]   [email or web address]: [email or web address]  [email or web address]

## 2023-03-14 ENCOUNTER — Encounter: Payer: Self-pay | Admitting: Pulmonary Disease

## 2023-03-14 ENCOUNTER — Ambulatory Visit: Payer: Medicare PPO | Admitting: Pulmonary Disease

## 2023-03-14 VITALS — BP 126/62 | HR 93 | Ht 65.0 in | Wt 235.4 lb

## 2023-03-14 DIAGNOSIS — Z7901 Long term (current) use of anticoagulants: Secondary | ICD-10-CM | POA: Diagnosis not present

## 2023-03-14 MED ORDER — APIXABAN 5 MG PO TABS: 5.0000 mg | ORAL_TABLET | Freq: Two times a day (BID) | ORAL | 0 refills | Status: DC

## 2023-03-14 NOTE — Patient Instructions (Signed)
Prescription for Eliquis 5 mg twice a day sent to pharmacy for you for a month with no refills  We will see you back a year from now  Call with significant concerns
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
## 2023-03-16 ENCOUNTER — Ambulatory Visit: Payer: Medicare PPO | Admitting: Emergency Medicine

## 2023-03-16 ENCOUNTER — Encounter: Payer: Self-pay | Admitting: Emergency Medicine

## 2023-03-16 VITALS — BP 120/84 | HR 83 | Temp 98.3°F | Ht 65.0 in | Wt 231.0 lb

## 2023-03-16 DIAGNOSIS — I10 Essential (primary) hypertension: Secondary | ICD-10-CM | POA: Diagnosis not present

## 2023-03-16 DIAGNOSIS — E785 Hyperlipidemia, unspecified: Secondary | ICD-10-CM

## 2023-03-16 DIAGNOSIS — Z86711 Personal history of pulmonary embolism: Secondary | ICD-10-CM

## 2023-03-16 DIAGNOSIS — M069 Rheumatoid arthritis, unspecified: Secondary | ICD-10-CM

## 2023-03-16 DIAGNOSIS — Z7901 Long term (current) use of anticoagulants: Secondary | ICD-10-CM | POA: Diagnosis not present

## 2023-03-16 LAB — CBC WITH DIFFERENTIAL/PLATELET
Basophils Absolute: 0.1 10*3/uL (ref 0.0–0.1)
Basophils Relative: 0.9 % (ref 0.0–3.0)
Eosinophils Absolute: 0.2 10*3/uL (ref 0.0–0.7)
Eosinophils Relative: 3.3 % (ref 0.0–5.0)
HCT: 46 % (ref 39.0–52.0)
Hemoglobin: 14.9 g/dL (ref 13.0–17.0)
Lymphocytes Relative: 22.8 % (ref 12.0–46.0)
Lymphs Abs: 1.7 10*3/uL (ref 0.7–4.0)
MCHC: 32.3 g/dL (ref 30.0–36.0)
MCV: 90.2 fl (ref 78.0–100.0)
Monocytes Absolute: 0.6 10*3/uL (ref 0.1–1.0)
Monocytes Relative: 8.7 % (ref 3.0–12.0)
Neutro Abs: 4.8 10*3/uL (ref 1.4–7.7)
Neutrophils Relative %: 64.3 % (ref 43.0–77.0)
Platelets: 287 10*3/uL (ref 150.0–400.0)
RBC: 5.1 Mil/uL (ref 4.22–5.81)
RDW: 14.9 % (ref 11.5–15.5)
WBC: 7.5 10*3/uL (ref 4.0–10.5)

## 2023-03-16 LAB — LIPID PANEL
Cholesterol: 150 mg/dL (ref 0–200)
HDL: 48.9 mg/dL (ref >39.00)
LDL Cholesterol: 92 mg/dL (ref 0–99)
NonHDL: 101.57
Total CHOL/HDL Ratio: 3
Triglycerides: 48 mg/dL (ref 0.0–149.0)
VLDL: 9.6 mg/dL (ref 0.0–40.0)

## 2023-03-16 LAB — COMPREHENSIVE METABOLIC PANEL
ALT: 28 U/L (ref 0–53)
AST: 25 U/L (ref 0–37)
Albumin: 4.2 g/dL (ref 3.5–5.2)
Alkaline Phosphatase: 47 U/L (ref 39–117)
BUN: 14 mg/dL (ref 6–23)
CO2: 26 mEq/L (ref 19–32)
Calcium: 9.6 mg/dL (ref 8.4–10.5)
Chloride: 105 mEq/L (ref 96–112)
Creatinine, Ser: 1.12 mg/dL (ref 0.40–1.50)
GFR: 66.14 mL/min (ref >60.00)
Glucose, Bld: 79 mg/dL (ref 70–99)
Potassium: 4.1 mEq/L (ref 3.5–5.1)
Sodium: 138 mEq/L (ref 135–145)
Total Bilirubin: 1 mg/dL (ref 0.2–1.2)
Total Protein: 7.6 g/dL (ref 6.0–8.3)

## 2023-03-16 LAB — HEMOGLOBIN A1C: Hgb A1c MFr Bld: 4.8 % (ref 4.6–6.5)

## 2023-03-16 NOTE — Assessment & Plan Note (Signed)
Well-controlled No deformities on physical examination Not affecting quality of life Continues methotrexate 15 mg weekly

## 2023-03-16 NOTE — Assessment & Plan Note (Signed)
Chronic stable condition Lipid profile done today Continue rosuvastatin 10 mg daily. The 10-year ASCVD risk score (Arnett DK, et al., 2019) is: 18.2%   Values used to calculate the score:     Age: 72 years     Sex: Male     Is Non-Hispanic African American: Yes     Diabetic: No     Tobacco smoker: No     Systolic Blood Pressure: 123456 mmHg     Is BP treated: Yes     HDL Cholesterol: 52 mg/dL     Total Cholesterol: 239 mg/dL

## 2023-03-16 NOTE — Patient Instructions (Signed)
Health Maintenance After Age 72 After age 72, you are at a higher risk for certain long-term diseases and infections as well as injuries from falls. Falls are a major cause of broken bones and head injuries in people who are older than age 72. Getting regular preventive care can help to keep you healthy and well. Preventive care includes getting regular testing and making lifestyle changes as recommended by your health care provider. Talk with your health care provider about: Which screenings and tests you should have. A screening is a test that checks for a disease when you have no symptoms. A diet and exercise plan that is right for you. What should I know about screenings and tests to prevent falls? Screening and testing are the best ways to find a health problem early. Early diagnosis and treatment give you the best chance of managing medical conditions that are common after age 72. Certain conditions and lifestyle choices may make you more likely to have a fall. Your health care provider may recommend: Regular vision checks. Poor vision and conditions such as cataracts can make you more likely to have a fall. If you wear glasses, make sure to get your prescription updated if your vision changes. Medicine review. Work with your health care provider to regularly review all of the medicines you are taking, including over-the-counter medicines. Ask your health care provider about any side effects that may make you more likely to have a fall. Tell your health care provider if any medicines that you take make you feel dizzy or sleepy. Strength and balance checks. Your health care provider may recommend certain tests to check your strength and balance while standing, walking, or changing positions. Foot health exam. Foot pain and numbness, as well as not wearing proper footwear, can make you more likely to have a fall. Screenings, including: Osteoporosis screening. Osteoporosis is a condition that causes  the bones to get weaker and break more easily. Blood pressure screening. Blood pressure changes and medicines to control blood pressure can make you feel dizzy. Depression screening. You may be more likely to have a fall if you have a fear of falling, feel depressed, or feel unable to do activities that you used to do. Alcohol use screening. Using too much alcohol can affect your balance and may make you more likely to have a fall. Follow these instructions at home: Lifestyle Do not drink alcohol if: Your health care provider tells you not to drink. If you drink alcohol: Limit how much you have to: 0-1 drink a day for women. 0-2 drinks a day for men. Know how much alcohol is in your drink. In the [place] one drink equals one 12 oz bottle of beer (355 mL), one 5 oz glass of wine (148 mL), or one 1 oz glass of hard liquor (44 mL). Do not use any products that contain nicotine or tobacco. These products include cigarettes, chewing tobacco, and vaping devices, such as e-cigarettes. If you need help quitting, ask your health care provider. Activity  Follow a regular exercise program to stay fit. This will help you maintain your balance. Ask your health care provider what types of exercise are appropriate for you. If you need a cane or walker, use it as recommended by your health care provider. Wear supportive shoes that have nonskid soles. Safety  Remove any tripping hazards, such as rugs, cords, and clutter. Install safety equipment such as grab bars in bathrooms and safety rails on stairs. Keep rooms and walkways   well-lit. General instructions Talk with your health care provider about your risks for falling. Tell your health care provider if: You fall. Be sure to tell your health care provider about all falls, even ones that seem minor. You feel dizzy, tiredness (fatigue), or off-balance. Take over-the-counter and prescription medicines only as told by your health care provider. These include  supplements. Eat a healthy diet and maintain a healthy weight. A healthy diet includes low-fat dairy products, low-fat (lean) meats, and fiber from whole grains, beans, and lots of fruits and vegetables. Stay current with your vaccines. Schedule regular health, dental, and eye exams. Summary Having a healthy lifestyle and getting preventive care can help to protect your health and wellness after age 72. Screening and testing are the best way to find a health problem early and help you avoid having a fall. Early diagnosis and treatment give you the best chance for managing medical conditions that are more common for people who are older than age 72. Falls are a major cause of broken bones and head injuries in people who are older than age 72. Take precautions to prevent a fall at home. Work with your health care provider to learn what changes you can make to improve your health and wellness and to prevent falls. This information is not intended to replace advice given to you by your health care provider. Make sure you discuss any questions you have with your health care provider. Document Revised: 04/20/2021 Document Reviewed: 04/20/2021 Elsevier Patient Education  2023 [employer]  

## 2023-03-16 NOTE — Assessment & Plan Note (Signed)
On long-term anticoagulation for lifetime Continue Eliquis 5 mg twice a day
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
## 2023-03-16 NOTE — Assessment & Plan Note (Signed)
No clinical bleeding problems No recent falls

## 2023-03-16 NOTE — Assessment & Plan Note (Signed)
Well-controlled hypertension. Continue valsartan 80 mg daily. Cardiovascular risk associated with hypertension discussed. Dietary approaches to stop hypertension discussed. 

## 2023-04-01 ENCOUNTER — Ambulatory Visit: Payer: Medicare PPO | Admitting: Pulmonary Disease

## 2023-04-01 ENCOUNTER — Encounter: Payer: Self-pay | Admitting: Pulmonary Disease

## 2023-04-01 VITALS — BP 130/74 | HR 71 | Wt 235.0 lb

## 2023-04-01 DIAGNOSIS — I2609 Other pulmonary embolism with acute cor pulmonale: Secondary | ICD-10-CM

## 2023-04-01 MED ORDER — APIXABAN 5 MG PO TABS: 5.0000 mg | ORAL_TABLET | Freq: Two times a day (BID) | ORAL | 3 refills | Status: DC

## 2023-04-01 NOTE — Patient Instructions (Signed)
It is nice to see you again  Continue Eliquis 5 mg twice a day  I sent a new prescription with 1 years worth of refills to [facility], each prescription should be for 82-month supply at a time.  Return to clinic in 1 year or sooner as needed with Dr. Judeth Horn
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
## 2023-04-18 DIAGNOSIS — M0609 Rheumatoid arthritis without rheumatoid factor, multiple sites: Secondary | ICD-10-CM | POA: Diagnosis not present

## 2023-04-18 DIAGNOSIS — R768 Other specified abnormal immunological findings in serum: Secondary | ICD-10-CM | POA: Diagnosis not present

## 2023-04-18 DIAGNOSIS — M8580 Other specified disorders of bone density and structure, unspecified site: Secondary | ICD-10-CM | POA: Diagnosis not present

## 2023-04-18 DIAGNOSIS — M1990 Unspecified osteoarthritis, unspecified site: Secondary | ICD-10-CM | POA: Diagnosis not present

## 2023-04-18 DIAGNOSIS — M25569 Pain in unspecified knee: Secondary | ICD-10-CM | POA: Diagnosis not present

## 2023-04-18 DIAGNOSIS — Z79899 Other long term (current) drug therapy: Secondary | ICD-10-CM | POA: Diagnosis not present

## 2023-06-09 ENCOUNTER — Ambulatory Visit (INDEPENDENT_AMBULATORY_CARE_PROVIDER_SITE_OTHER): Payer: Medicare PPO

## 2023-06-09 ENCOUNTER — Encounter: Payer: Self-pay | Admitting: Emergency Medicine

## 2023-06-09 ENCOUNTER — Ambulatory Visit: Payer: Medicare PPO | Admitting: Emergency Medicine

## 2023-06-09 VITALS — BP 134/80 | HR 78 | Temp 98.2°F | Ht 65.0 in | Wt 237.1 lb

## 2023-06-09 DIAGNOSIS — I2782 Chronic pulmonary embolism: Secondary | ICD-10-CM

## 2023-06-09 DIAGNOSIS — I10 Essential (primary) hypertension: Secondary | ICD-10-CM

## 2023-06-09 DIAGNOSIS — R053 Chronic cough: Secondary | ICD-10-CM

## 2023-06-09 DIAGNOSIS — M069 Rheumatoid arthritis, unspecified: Secondary | ICD-10-CM

## 2023-06-09 DIAGNOSIS — E785 Hyperlipidemia, unspecified: Secondary | ICD-10-CM

## 2023-06-09 NOTE — Patient Instructions (Signed)
Cough, Adult A cough helps to clear your throat and lungs. It may be a sign of an illness or another condition. A short-term (acute) cough may last 2-3 weeks. A long-term (chronic) cough may last 8 or more weeks. Many things can cause a cough. They include: Illnesses such as: An infection in your throat or lungs. Asthma or other heart or lung problems. Gastroesophageal reflux. This is when acid comes back up from your stomach. Breathing in things that bother (irritate) your lungs. Allergies. Postnasal drip. This is when mucus runs down the back of your throat. Smoking. Some medicines. Follow these instructions at home: Medicines Take over-the-counter and prescription medicines only as told by your doctor. Talk with your doctor before you take cough medicine (cough suppressants). Eating and drinking Do not drink alcohol. Do not drink caffeine. Drink enough fluid to keep your pee (urine) pale yellow. Lifestyle Stay away from cigarette smoke. Do not smoke or use any products that contain nicotine or tobacco. If you need help quitting, ask your doctor. Stay away from things that make you cough. These may include perfume, candles, cleaning products, or campfire smoke. General instructions  Watch for any changes to your cough. Tell your doctor about them. Always cover your mouth when you cough. If the air is dry in your home, use a cool mist vaporizer or humidifier. If your cough is worse at night, try using extra pillows to raise your head up higher while you sleep. Rest as needed. Contact a doctor if: You have new symptoms. Your symptoms get worse. You cough up pus. You have a fever that does not go away. Your cough does not get better after 2-3 weeks. Cough medicine does not help, and you are not sleeping well. You have pain that gets worse or is not helped with medicine. You are losing weight and do not know why. You have night sweats. Get help right away if: You cough up  blood. You have trouble breathing. Your heart is beating very fast. These symptoms may be an emergency. Get help right away. Call 911. Do not wait to see if the symptoms will go away. Do not drive yourself to the hospital. This information is not intended to replace advice given to you by your health care provider. Make sure you discuss any questions you have with your health care provider. Document Revised: 07/30/2022 Document Reviewed: 07/30/2022 Elsevier Patient Education  2024 Elsevier Inc[employer]  
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
## 2023-06-09 NOTE — Assessment & Plan Note (Signed)
Clinically stable.  No red flag signs or symptoms. Chest x-ray done today.  Report reviewed. Cough management discussed Recommend over-the-counter Mucinex DM

## 2023-06-09 NOTE — Assessment & Plan Note (Signed)
BP Readings from Last 3 Encounters:  06/09/23 134/80  04/01/23 130/74  03/16/23 120/84  Well-controlled hypertension Continue valsartan 80 mg daily Cardiovascular risk associated with hypertension discussed Dietary approaches to stop hypertension discussed

## 2023-06-09 NOTE — Assessment & Plan Note (Signed)
Stable.  On methotrexate 15 mg weekly

## 2023-06-09 NOTE — Assessment & Plan Note (Signed)
Diet and nutrition discussed. Continue rosuvastatin 10 mg daily.  

## 2023-06-09 NOTE — Assessment & Plan Note (Signed)
Diet and nutrition discussed.  Advised to decrease amount of daily carbohydrate intake and daily calories and increase amount of plant based protein in his diet 

## 2023-06-09 NOTE — Assessment & Plan Note (Signed)
Stable.  Continues Eliquis 5 mg twice a day

## 2023-06-15 ENCOUNTER — Ambulatory Visit (INDEPENDENT_AMBULATORY_CARE_PROVIDER_SITE_OTHER): Payer: Medicare PPO

## 2023-06-15 VITALS — Ht 65.0 in | Wt 237.0 lb

## 2023-06-15 DIAGNOSIS — Z0000 Encounter for general adult medical examination without abnormal findings: Secondary | ICD-10-CM | POA: Diagnosis not present

## 2023-06-15 NOTE — Patient Instructions (Addendum)
[name] , Thank you for taking time to come for your Medicare Wellness Visit. I appreciate your ongoing commitment to your health goals. Please review the following plan we discussed and let me know if I can assist you in the future.   These are the goals we discussed:  Goals      My healthcare goal for 2024 is to maintain my current health status by continuing to eat healthy, stay independent, physically and socially active.        This is a list of the screening recommended for you and due dates:  Health Maintenance  Topic Date Due   Hepatitis C Screening  Never done   COVID-19 Vaccine (6 - 2023-24 season) 08/13/2022   Flu Shot  07/14/2023   Medicare Annual Wellness Visit  06/14/2024   Colon Cancer Screening  04/17/2028   Pneumonia Vaccine  Completed   Zoster (Shingles) Vaccine  Completed   HPV Vaccine  Aged Out   DTaP/Tdap/Td vaccine  Discontinued    Advanced directives: No; Advance directive discussed with you today. Even though you declined this today please call our office should you change your mind and we can give you the proper paperwork for you to fill out.  Conditions/risks identified: Yes  Next appointment: It was nice speaking with you today!  Please follow up in one year for your annual wellness visit via telephone call with Nurse Percell Miller on 06/19/2024 at 8:45 a.m.  If you need to cancel or reschedule please call [phone].  Preventive Care 22 Years and Older, Male  Preventive care refers to lifestyle choices and visits with your health care provider that can promote health and wellness. What does preventive care include? A yearly physical exam. This is also called an annual well check. Dental exams once or twice a year. Routine eye exams. Ask your health care provider how often you should have your eyes checked. Personal lifestyle choices, including: Daily care of your teeth and gums. Regular physical activity. Eating a healthy diet. Avoiding tobacco and  drug use. Limiting alcohol use. Practicing safe sex. Taking low doses of aspirin every day. Taking vitamin and mineral supplements as recommended by your health care provider. What happens during an annual well check? The services and screenings done by your health care provider during your annual well check will depend on your age, overall health, lifestyle risk factors, and family history of disease. Counseling  Your health care provider may ask you questions about your: Alcohol use. Tobacco use. Drug use. Emotional well-being. Home and relationship well-being. Sexual activity. Eating habits. History of falls. Memory and ability to understand (cognition). Work and work Astronomer. Screening  You may have the following tests or measurements: Height, weight, and BMI. Blood pressure. Lipid and cholesterol levels. These may be checked every 5 years, or more frequently if you are over 64 years old. Skin check. Lung cancer screening. You may have this screening every year starting at age 31 if you have a 30-pack-year history of smoking and currently smoke or have quit within the past 15 years. Fecal occult blood test (FOBT) of the stool. You may have this test every year starting at age 70. Flexible sigmoidoscopy or colonoscopy. You may have a sigmoidoscopy every 5 years or a colonoscopy every 10 years starting at age 16. Prostate cancer screening. Recommendations will vary depending on your family history and other risks. Hepatitis C blood test. Hepatitis B blood test. Sexually transmitted disease (STD) testing. Diabetes screening. This is  done by checking your blood sugar (glucose) after you have not eaten for a while (fasting). You may have this done every 1-3 years. Abdominal aortic aneurysm (AAA) screening. You may need this if you are a current or former smoker. Osteoporosis. You may be screened starting at age 55 if you are at high risk. Talk with your health care provider  about your test results, treatment options, and if necessary, the need for more tests. Vaccines  Your health care provider may recommend certain vaccines, such as: Influenza vaccine. This is recommended every year. Tetanus, diphtheria, and acellular pertussis (Tdap, Td) vaccine. You may need a Td booster every 10 years. Zoster vaccine. You may need this after age 70. Pneumococcal 13-valent conjugate (PCV13) vaccine. One dose is recommended after age 45. Pneumococcal polysaccharide (PPSV23) vaccine. One dose is recommended after age 39. Talk to your health care provider about which screenings and vaccines you need and how often you need them. This information is not intended to replace advice given to you by your health care provider. Make sure you discuss any questions you have with your health care provider. Document Released: 12/26/2015 Document Revised: 08/18/2016 Document Reviewed: 09/30/2015 Elsevier Interactive Patient Education  2017 [employer].  Fall Prevention in the Home Falls can cause injuries. They can happen to people of all ages. There are many things you can do to make your home safe and to help prevent falls. What can I do on the outside of my home? Regularly fix the edges of walkways and driveways and fix any cracks. Remove anything that might make you trip as you walk through a door, such as a raised step or threshold. Trim any bushes or trees on the path to your home. Use bright outdoor lighting. Clear any walking paths of anything that might make someone trip, such as rocks or tools. Regularly check to see if handrails are loose or broken. Make sure that both sides of any steps have handrails. Any raised decks and porches should have guardrails on the edges. Have any leaves, snow, or ice cleared regularly. Use sand or salt on walking paths during winter. Clean up any spills in your garage right away. This includes oil or grease spills. What can I do in the  bathroom? Use night lights. Install grab bars by the toilet and in the tub and shower. Do not use towel bars as grab bars. Use non-skid mats or decals in the tub or shower. If you need to sit down in the shower, use a plastic, non-slip stool. Keep the floor dry. Clean up any water that spills on the floor as soon as it happens. Remove soap buildup in the tub or shower regularly. Attach bath mats securely with double-sided non-slip rug tape. Do not have throw rugs and other things on the floor that can make you trip. What can I do in the bedroom? Use night lights. Make sure that you have a light by your bed that is easy to reach. Do not use any sheets or blankets that are too big for your bed. They should not hang down onto the floor. Have a firm chair that has side arms. You can use this for support while you get dressed. Do not have throw rugs and other things on the floor that can make you trip. What can I do in the kitchen? Clean up any spills right away. Avoid walking on wet floors. Keep items that you use a lot in easy-to-reach places. If you need  to reach something above you, use a strong step stool that has a grab bar. Keep electrical cords out of the way. Do not use floor polish or wax that makes floors slippery. If you must use wax, use non-skid floor wax. Do not have throw rugs and other things on the floor that can make you trip. What can I do with my stairs? Do not leave any items on the stairs. Make sure that there are handrails on both sides of the stairs and use them. Fix handrails that are broken or loose. Make sure that handrails are as long as the stairways. Check any carpeting to make sure that it is firmly attached to the stairs. Fix any carpet that is loose or worn. Avoid having throw rugs at the top or bottom of the stairs. If you do have throw rugs, attach them to the floor with carpet tape. Make sure that you have a light switch at the top of the stairs and the  bottom of the stairs. If you do not have them, ask someone to add them for you. What else can I do to help prevent falls? Wear shoes that: Do not have high heels. Have rubber bottoms. Are comfortable and fit you well. Are closed at the toe. Do not wear sandals. If you use a stepladder: Make sure that it is fully opened. Do not climb a closed stepladder. Make sure that both sides of the stepladder are locked into place. Ask someone to hold it for you, if possible. Clearly mark and make sure that you can see: Any grab bars or handrails. First and last steps. Where the edge of each step is. Use tools that help you move around (mobility aids) if they are needed. These include: Canes. Walkers. Scooters. Crutches. Turn on the lights when you go into a dark area. Replace any light bulbs as soon as they burn out. Set up your furniture so you have a clear path. Avoid moving your furniture around. If any of your floors are uneven, fix them. If there are any pets around you, be aware of where they are. Review your medicines with your doctor. Some medicines can make you feel dizzy. This can increase your chance of falling. Ask your doctor what other things that you can do to help prevent falls. This information is not intended to replace advice given to you by your health care provider. Make sure you discuss any questions you have with your health care provider. Document Released: 09/25/2009 Document Revised: 05/06/2016 Document Reviewed: 01/03/2015 Elsevier Interactive Patient Education  2017 [employer].
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
## 2023-07-19 DIAGNOSIS — R768 Other specified abnormal immunological findings in serum: Secondary | ICD-10-CM | POA: Diagnosis not present

## 2023-07-19 DIAGNOSIS — M1990 Unspecified osteoarthritis, unspecified site: Secondary | ICD-10-CM | POA: Diagnosis not present

## 2023-07-19 DIAGNOSIS — M25569 Pain in unspecified knee: Secondary | ICD-10-CM | POA: Diagnosis not present

## 2023-07-19 DIAGNOSIS — M0609 Rheumatoid arthritis without rheumatoid factor, multiple sites: Secondary | ICD-10-CM | POA: Diagnosis not present

## 2023-07-19 DIAGNOSIS — Z79899 Other long term (current) drug therapy: Secondary | ICD-10-CM | POA: Diagnosis not present

## 2023-07-19 DIAGNOSIS — M8580 Other specified disorders of bone density and structure, unspecified site: Secondary | ICD-10-CM | POA: Diagnosis not present

## 2023-08-21 ENCOUNTER — Other Ambulatory Visit: Payer: Self-pay | Admitting: Emergency Medicine

## 2023-08-21 DIAGNOSIS — E785 Hyperlipidemia, unspecified: Secondary | ICD-10-CM

## 2023-08-21 DIAGNOSIS — I10 Essential (primary) hypertension: Secondary | ICD-10-CM

## 2023-09-15 ENCOUNTER — Ambulatory Visit: Payer: Medicare PPO | Admitting: Emergency Medicine

## 2023-09-15 ENCOUNTER — Encounter: Payer: Self-pay | Admitting: Emergency Medicine

## 2023-09-15 VITALS — BP 130/78 | HR 63 | Temp 98.4°F | Ht 65.0 in | Wt 232.0 lb

## 2023-09-15 DIAGNOSIS — E785 Hyperlipidemia, unspecified: Secondary | ICD-10-CM

## 2023-09-15 DIAGNOSIS — G8929 Other chronic pain: Secondary | ICD-10-CM

## 2023-09-15 DIAGNOSIS — Z23 Encounter for immunization: Secondary | ICD-10-CM

## 2023-09-15 DIAGNOSIS — M25561 Pain in right knee: Secondary | ICD-10-CM | POA: Insufficient documentation

## 2023-09-15 DIAGNOSIS — I10 Essential (primary) hypertension: Secondary | ICD-10-CM | POA: Diagnosis not present

## 2023-09-15 DIAGNOSIS — I2782 Chronic pulmonary embolism: Secondary | ICD-10-CM | POA: Diagnosis not present

## 2023-09-15 DIAGNOSIS — M25551 Pain in right hip: Secondary | ICD-10-CM | POA: Diagnosis not present

## 2023-09-15 DIAGNOSIS — M069 Rheumatoid arthritis, unspecified: Secondary | ICD-10-CM | POA: Diagnosis not present

## 2023-09-15 LAB — LIPID PANEL
Cholesterol: 139 mg/dL (ref 0–200)
HDL: 46.5 mg/dL (ref >39.00)
LDL Cholesterol: 80 mg/dL (ref 0–99)
NonHDL: 92.09
Total CHOL/HDL Ratio: 3
Triglycerides: 59 mg/dL (ref 0.0–149.0)
VLDL: 11.8 mg/dL (ref 0.0–40.0)

## 2023-09-15 LAB — CBC WITH DIFFERENTIAL/PLATELET
Basophils Absolute: 0 10*3/uL (ref 0.0–0.1)
Basophils Relative: 0.8 % (ref 0.0–3.0)
Eosinophils Absolute: 0.3 10*3/uL (ref 0.0–0.7)
Eosinophils Relative: 4.9 % (ref 0.0–5.0)
HCT: 43.6 % (ref 39.0–52.0)
Hemoglobin: 14.1 g/dL (ref 13.0–17.0)
Lymphocytes Relative: 25.8 % (ref 12.0–46.0)
Lymphs Abs: 1.5 10*3/uL (ref 0.7–4.0)
MCHC: 32.2 g/dL (ref 30.0–36.0)
MCV: 91.4 fL (ref 78.0–100.0)
Monocytes Absolute: 0.6 10*3/uL (ref 0.1–1.0)
Monocytes Relative: 11 % (ref 3.0–12.0)
Neutro Abs: 3.4 10*3/uL (ref 1.4–7.7)
Neutrophils Relative %: 57.5 % (ref 43.0–77.0)
Platelets: 231 10*3/uL (ref 150.0–400.0)
RBC: 4.77 Mil/uL (ref 4.22–5.81)
RDW: 14.3 % (ref 11.5–15.5)
WBC: 5.9 10*3/uL (ref 4.0–10.5)

## 2023-09-15 LAB — COMPREHENSIVE METABOLIC PANEL
ALT: 21 U/L (ref 0–53)
AST: 22 U/L (ref 0–37)
Albumin: 4.1 g/dL (ref 3.5–5.2)
Alkaline Phosphatase: 42 U/L (ref 39–117)
BUN: 11 mg/dL (ref 6–23)
CO2: 27 meq/L (ref 19–32)
Calcium: 9.5 mg/dL (ref 8.4–10.5)
Chloride: 104 meq/L (ref 96–112)
Creatinine, Ser: 1.13 mg/dL (ref 0.40–1.50)
GFR: 65.21 mL/min (ref >60.00)
Glucose, Bld: 86 mg/dL (ref 70–99)
Potassium: 4 meq/L (ref 3.5–5.1)
Sodium: 136 meq/L (ref 135–145)
Total Bilirubin: 0.9 mg/dL (ref 0.2–1.2)
Total Protein: 7.3 g/dL (ref 6.0–8.3)

## 2023-09-15 LAB — HEMOGLOBIN A1C: Hgb A1c MFr Bld: 4.7 % (ref 4.6–6.5)

## 2023-09-15 NOTE — Assessment & Plan Note (Signed)
Diet and nutrition discussed Lipid profile done today Continues rosuvastatin 10 mg daily The 10-year ASCVD risk score (Arnett DK, et al., 2019) is: 18.7%   Values used to calculate the score:     Age: 72 years     Sex: Male     Is Non-Hispanic African American: Yes     Diabetic: No     Tobacco smoker: No     Systolic Blood Pressure: 130 mmHg     Is BP treated: Yes     HDL Cholesterol: 48.9 mg/dL     Total Cholesterol: 150 mg/dL

## 2023-09-15 NOTE — Assessment & Plan Note (Signed)
History of osteoarthritis Intermittent symptoms affecting quality of life Recommend orthopedic evaluation Referral placed today

## 2023-09-15 NOTE — Assessment & Plan Note (Signed)
On lifetime anticoagulation No significant clinical bleeding episodes Continues Eliquis 5 mg twice a day Stable chronic condition

## 2023-09-15 NOTE — Assessment & Plan Note (Signed)
Diet and nutrition discussed.  Advised to decrease amount of daily carbohydrate intake and daily calories and increase amount of plant based protein in his diet 
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
## 2023-09-15 NOTE — Patient Instructions (Signed)
Health Maintenance After Age 72 After age 72, you are at a higher risk for certain long-term diseases and infections as well as injuries from falls. Falls are a major cause of broken bones and head injuries in people who are older than age 72. Getting regular preventive care can help to keep you healthy and well. Preventive care includes getting regular testing and making lifestyle changes as recommended by your health care provider. Talk with your health care provider about: Which screenings and tests you should have. A screening is a test that checks for a disease when you have no symptoms. A diet and exercise plan that is right for you. What should I know about screenings and tests to prevent falls? Screening and testing are the best ways to find a health problem early. Early diagnosis and treatment give you the best chance of managing medical conditions that are common after age 72. Certain conditions and lifestyle choices may make you more likely to have a fall. Your health care provider may recommend: Regular vision checks. Poor vision and conditions such as cataracts can make you more likely to have a fall. If you wear glasses, make sure to get your prescription updated if your vision changes. Medicine review. Work with your health care provider to regularly review all of the medicines you are taking, including over-the-counter medicines. Ask your health care provider about any side effects that may make you more likely to have a fall. Tell your health care provider if any medicines that you take make you feel dizzy or sleepy. Strength and balance checks. Your health care provider may recommend certain tests to check your strength and balance while standing, walking, or changing positions. Foot health exam. Foot pain and numbness, as well as not wearing proper footwear, can make you more likely to have a fall. Screenings, including: Osteoporosis screening. Osteoporosis is a condition that causes  the bones to get weaker and break more easily. Blood pressure screening. Blood pressure changes and medicines to control blood pressure can make you feel dizzy. Depression screening. You may be more likely to have a fall if you have a fear of falling, feel depressed, or feel unable to do activities that you used to do. Alcohol use screening. Using too much alcohol can affect your balance and may make you more likely to have a fall. Follow these instructions at home: Lifestyle Do not drink alcohol if: Your health care provider tells you not to drink. If you drink alcohol: Limit how much you have to: 0-1 drink a day for women. 0-2 drinks a day for men. Know how much alcohol is in your drink. In the [place] one drink equals one 12 oz bottle of beer (355 mL), one 5 oz glass of wine (148 mL), or one 1 oz glass of hard liquor (44 mL). Do not use any products that contain nicotine or tobacco. These products include cigarettes, chewing tobacco, and vaping devices, such as e-cigarettes. If you need help quitting, ask your health care provider. Activity  Follow a regular exercise program to stay fit. This will help you maintain your balance. Ask your health care provider what types of exercise are appropriate for you. If you need a cane or walker, use it as recommended by your health care provider. Wear supportive shoes that have nonskid soles. Safety  Remove any tripping hazards, such as rugs, cords, and clutter. Install safety equipment such as grab bars in bathrooms and safety rails on stairs. Keep rooms and walkways   well-lit. General instructions Talk with your health care provider about your risks for falling. Tell your health care provider if: You fall. Be sure to tell your health care provider about all falls, even ones that seem minor. You feel dizzy, tiredness (fatigue), or off-balance. Take over-the-counter and prescription medicines only as told by your health care provider. These include  supplements. Eat a healthy diet and maintain a healthy weight. A healthy diet includes low-fat dairy products, low-fat (lean) meats, and fiber from whole grains, beans, and lots of fruits and vegetables. Stay current with your vaccines. Schedule regular health, dental, and eye exams. Summary Having a healthy lifestyle and getting preventive care can help to protect your health and wellness after age 72. Screening and testing are the best way to find a health problem early and help you avoid having a fall. Early diagnosis and treatment give you the best chance for managing medical conditions that are more common for people who are older than age 72. Falls are a major cause of broken bones and head injuries in people who are older than age 72. Take precautions to prevent a fall at home. Work with your health care provider to learn what changes you can make to improve your health and wellness and to prevent falls. This information is not intended to replace advice given to you by your health care provider. Make sure you discuss any questions you have with your health care provider. Document Revised: 04/20/2021 Document Reviewed: 04/20/2021 Elsevier Patient Education  2024 Elsevier Inc.  

## 2023-09-15 NOTE — Assessment & Plan Note (Signed)
Well-controlled hypertension Cardiovascular risks associated with hypertension discussed Continues valsartan 80 mg daily Dietary approaches to stop hypertension discussed

## 2023-09-15 NOTE — Assessment & Plan Note (Signed)
Stable and well-controlled. Continues methotrexate 15 mg weekly

## 2023-09-21 ENCOUNTER — Other Ambulatory Visit (INDEPENDENT_AMBULATORY_CARE_PROVIDER_SITE_OTHER): Payer: Self-pay

## 2023-09-21 ENCOUNTER — Encounter: Payer: Self-pay | Admitting: Physician Assistant

## 2023-09-21 ENCOUNTER — Ambulatory Visit: Payer: Medicare PPO | Admitting: Physician Assistant

## 2023-09-21 ENCOUNTER — Other Ambulatory Visit (INDEPENDENT_AMBULATORY_CARE_PROVIDER_SITE_OTHER): Payer: Medicare PPO

## 2023-09-21 DIAGNOSIS — M25561 Pain in right knee: Secondary | ICD-10-CM

## 2023-09-21 DIAGNOSIS — G8929 Other chronic pain: Secondary | ICD-10-CM

## 2023-09-21 DIAGNOSIS — M545 Low back pain, unspecified: Secondary | ICD-10-CM | POA: Diagnosis not present
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
## 2023-10-03 ENCOUNTER — Ambulatory Visit: Payer: Medicare PPO | Admitting: Physical Therapy

## 2023-10-03 ENCOUNTER — Encounter: Payer: Self-pay | Admitting: Physical Therapy

## 2023-10-03 ENCOUNTER — Other Ambulatory Visit: Payer: Self-pay

## 2023-10-03 DIAGNOSIS — M6281 Muscle weakness (generalized): Secondary | ICD-10-CM | POA: Diagnosis not present

## 2023-10-03 DIAGNOSIS — M25561 Pain in right knee: Secondary | ICD-10-CM | POA: Diagnosis not present

## 2023-10-03 DIAGNOSIS — G8929 Other chronic pain: Secondary | ICD-10-CM | POA: Diagnosis not present

## 2023-10-03 DIAGNOSIS — R262 Difficulty in walking, not elsewhere classified: Secondary | ICD-10-CM
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
## 2023-10-07 ENCOUNTER — Encounter: Payer: Self-pay | Admitting: Rehabilitative and Restorative Service Providers"

## 2023-10-07 ENCOUNTER — Ambulatory Visit: Payer: Medicare PPO | Admitting: Rehabilitative and Restorative Service Providers"

## 2023-10-07 DIAGNOSIS — M25561 Pain in right knee: Secondary | ICD-10-CM | POA: Diagnosis not present

## 2023-10-07 DIAGNOSIS — R262 Difficulty in walking, not elsewhere classified: Secondary | ICD-10-CM

## 2023-10-07 DIAGNOSIS — M6281 Muscle weakness (generalized): Secondary | ICD-10-CM | POA: Diagnosis not present

## 2023-10-07 DIAGNOSIS — G8929 Other chronic pain: Secondary | ICD-10-CM | POA: Diagnosis not present
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
## 2023-10-14 ENCOUNTER — Encounter: Payer: Self-pay | Admitting: Rehabilitative and Restorative Service Providers"

## 2023-10-14 ENCOUNTER — Ambulatory Visit: Payer: Medicare PPO | Admitting: Rehabilitative and Restorative Service Providers"

## 2023-10-14 DIAGNOSIS — G8929 Other chronic pain: Secondary | ICD-10-CM | POA: Diagnosis not present

## 2023-10-14 DIAGNOSIS — R262 Difficulty in walking, not elsewhere classified: Secondary | ICD-10-CM | POA: Diagnosis not present

## 2023-10-14 DIAGNOSIS — M6281 Muscle weakness (generalized): Secondary | ICD-10-CM

## 2023-10-14 DIAGNOSIS — M25561 Pain in right knee: Secondary | ICD-10-CM
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
## 2023-10-17 ENCOUNTER — Ambulatory Visit: Payer: Medicare PPO | Admitting: Physical Therapy

## 2023-10-17 ENCOUNTER — Encounter: Payer: Self-pay | Admitting: Physical Therapy

## 2023-10-17 DIAGNOSIS — M6281 Muscle weakness (generalized): Secondary | ICD-10-CM

## 2023-10-17 DIAGNOSIS — M25561 Pain in right knee: Secondary | ICD-10-CM | POA: Diagnosis not present

## 2023-10-17 DIAGNOSIS — R262 Difficulty in walking, not elsewhere classified: Secondary | ICD-10-CM

## 2023-10-17 DIAGNOSIS — G8929 Other chronic pain: Secondary | ICD-10-CM
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
## 2023-10-19 ENCOUNTER — Encounter: Payer: Medicare PPO | Admitting: Physical Therapy

## 2023-10-19 DIAGNOSIS — Z79899 Other long term (current) drug therapy: Secondary | ICD-10-CM | POA: Diagnosis not present

## 2023-10-19 DIAGNOSIS — R768 Other specified abnormal immunological findings in serum: Secondary | ICD-10-CM | POA: Diagnosis not present

## 2023-10-19 DIAGNOSIS — M1990 Unspecified osteoarthritis, unspecified site: Secondary | ICD-10-CM | POA: Diagnosis not present

## 2023-10-19 DIAGNOSIS — M25569 Pain in unspecified knee: Secondary | ICD-10-CM | POA: Diagnosis not present

## 2023-10-19 DIAGNOSIS — M8580 Other specified disorders of bone density and structure, unspecified site: Secondary | ICD-10-CM | POA: Diagnosis not present

## 2023-10-19 DIAGNOSIS — M0609 Rheumatoid arthritis without rheumatoid factor, multiple sites: Secondary | ICD-10-CM | POA: Diagnosis not present

## 2023-10-24 ENCOUNTER — Encounter: Payer: Self-pay | Admitting: Physical Therapy

## 2023-10-24 ENCOUNTER — Ambulatory Visit: Payer: Medicare PPO | Admitting: Physical Therapy

## 2023-10-24 DIAGNOSIS — G8929 Other chronic pain: Secondary | ICD-10-CM

## 2023-10-24 DIAGNOSIS — M25561 Pain in right knee: Secondary | ICD-10-CM | POA: Diagnosis not present

## 2023-10-24 DIAGNOSIS — M6281 Muscle weakness (generalized): Secondary | ICD-10-CM | POA: Diagnosis not present

## 2023-10-24 DIAGNOSIS — R262 Difficulty in walking, not elsewhere classified: Secondary | ICD-10-CM
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
## 2023-10-26 ENCOUNTER — Encounter: Payer: Self-pay | Admitting: Physical Therapy

## 2023-10-26 ENCOUNTER — Ambulatory Visit: Payer: Medicare PPO | Admitting: Physical Therapy

## 2023-10-26 DIAGNOSIS — M25561 Pain in right knee: Secondary | ICD-10-CM

## 2023-10-26 DIAGNOSIS — R262 Difficulty in walking, not elsewhere classified: Secondary | ICD-10-CM

## 2023-10-26 DIAGNOSIS — G8929 Other chronic pain: Secondary | ICD-10-CM

## 2023-10-26 DIAGNOSIS — M6281 Muscle weakness (generalized): Secondary | ICD-10-CM
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
## 2023-11-17 ENCOUNTER — Other Ambulatory Visit: Payer: Self-pay | Admitting: Emergency Medicine

## 2023-11-17 DIAGNOSIS — I10 Essential (primary) hypertension: Secondary | ICD-10-CM

## 2023-11-17 DIAGNOSIS — E785 Hyperlipidemia, unspecified: Secondary | ICD-10-CM

## 2024-01-19 DIAGNOSIS — Z79899 Other long term (current) drug therapy: Secondary | ICD-10-CM | POA: Diagnosis not present

## 2024-01-19 DIAGNOSIS — M0609 Rheumatoid arthritis without rheumatoid factor, multiple sites: Secondary | ICD-10-CM | POA: Diagnosis not present

## 2024-01-19 DIAGNOSIS — M8589 Other specified disorders of bone density and structure, multiple sites: Secondary | ICD-10-CM | POA: Diagnosis not present

## 2024-01-19 DIAGNOSIS — R768 Other specified abnormal immunological findings in serum: Secondary | ICD-10-CM | POA: Diagnosis not present

## 2024-01-19 DIAGNOSIS — M8580 Other specified disorders of bone density and structure, unspecified site: Secondary | ICD-10-CM | POA: Diagnosis not present

## 2024-01-19 DIAGNOSIS — M1990 Unspecified osteoarthritis, unspecified site: Secondary | ICD-10-CM | POA: Diagnosis not present

## 2024-01-19 DIAGNOSIS — M25569 Pain in unspecified knee: Secondary | ICD-10-CM | POA: Diagnosis not present

## 2024-02-23 ENCOUNTER — Ambulatory Visit: Payer: Medicare PPO | Admitting: Pulmonary Disease

## 2024-02-23 ENCOUNTER — Encounter: Payer: Self-pay | Admitting: Pulmonary Disease

## 2024-02-23 VITALS — BP 148/79 | HR 56 | Ht 65.0 in | Wt 235.8 lb

## 2024-02-23 DIAGNOSIS — Z7901 Long term (current) use of anticoagulants: Secondary | ICD-10-CM | POA: Diagnosis not present

## 2024-02-23 DIAGNOSIS — I2782 Chronic pulmonary embolism: Secondary | ICD-10-CM

## 2024-02-23 MED ORDER — APIXABAN 5 MG PO TABS: 5.0000 mg | ORAL_TABLET | Freq: Two times a day (BID) | ORAL | 3 refills | Status: AC

## 2024-02-23 NOTE — Patient Instructions (Signed)
 Eliquis refilled.
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
## 2024-03-15 ENCOUNTER — Ambulatory Visit: Payer: Medicare PPO | Admitting: Emergency Medicine

## 2024-03-15 ENCOUNTER — Encounter: Payer: Self-pay | Admitting: Emergency Medicine

## 2024-03-15 VITALS — BP 132/78 | HR 75 | Temp 98.7°F | Ht 65.0 in | Wt 233.0 lb

## 2024-03-15 DIAGNOSIS — E785 Hyperlipidemia, unspecified: Secondary | ICD-10-CM

## 2024-03-15 DIAGNOSIS — I10 Essential (primary) hypertension: Secondary | ICD-10-CM

## 2024-03-15 DIAGNOSIS — Z7901 Long term (current) use of anticoagulants: Secondary | ICD-10-CM | POA: Diagnosis not present

## 2024-03-15 DIAGNOSIS — I2782 Chronic pulmonary embolism: Secondary | ICD-10-CM

## 2024-03-15 DIAGNOSIS — M069 Rheumatoid arthritis, unspecified: Secondary | ICD-10-CM | POA: Diagnosis not present

## 2024-03-15 NOTE — Patient Instructions (Signed)
 Health Maintenance After Age 73 After age 4, you are at a higher risk for certain long-term diseases and infections as well as injuries from falls. Falls are a major cause of broken bones and head injuries in people who are older than age 47. Getting regular preventive care can help to keep you healthy and well. Preventive care includes getting regular testing and making lifestyle changes as recommended by your health care provider. Talk with your health care provider about: Which screenings and tests you should have. A screening is a test that checks for a disease when you have no symptoms. A diet and exercise plan that is right for you. What should I know about screenings and tests to prevent falls? Screening and testing are the best ways to find a health problem early. Early diagnosis and treatment give you the best chance of managing medical conditions that are common after age 37. Certain conditions and lifestyle choices may make you more likely to have a fall. Your health care provider may recommend: Regular vision checks. Poor vision and conditions such as cataracts can make you more likely to have a fall. If you wear glasses, make sure to get your prescription updated if your vision changes. Medicine review. Work with your health care provider to regularly review all of the medicines you are taking, including over-the-counter medicines. Ask your health care provider about any side effects that may make you more likely to have a fall. Tell your health care provider if any medicines that you take make you feel dizzy or sleepy. Strength and balance checks. Your health care provider may recommend certain tests to check your strength and balance while standing, walking, or changing positions. Foot health exam. Foot pain and numbness, as well as not wearing proper footwear, can make you more likely to have a fall. Screenings, including: Osteoporosis screening. Osteoporosis is a condition that causes  the bones to get weaker and break more easily. Blood pressure screening. Blood pressure changes and medicines to control blood pressure can make you feel dizzy. Depression screening. You may be more likely to have a fall if you have a fear of falling, feel depressed, or feel unable to do activities that you used to do. Alcohol use screening. Using too much alcohol can affect your balance and may make you more likely to have a fall. Follow these instructions at home: Lifestyle Do not drink alcohol if: Your health care provider tells you not to drink. If you drink alcohol: Limit how much you have to: 0-1 drink a day for women. 0-2 drinks a day for men. Know how much alcohol is in your drink. In the [place] one drink equals one 12 oz bottle of beer (355 mL), one 5 oz glass of wine (148 mL), or one 1 oz glass of hard liquor (44 mL). Do not use any products that contain nicotine or tobacco. These products include cigarettes, chewing tobacco, and vaping devices, such as e-cigarettes. If you need help quitting, ask your health care provider. Activity  Follow a regular exercise program to stay fit. This will help you maintain your balance. Ask your health care provider what types of exercise are appropriate for you. If you need a cane or walker, use it as recommended by your health care provider. Wear supportive shoes that have nonskid soles. Safety  Remove any tripping hazards, such as rugs, cords, and clutter. Install safety equipment such as grab bars in bathrooms and safety rails on stairs. Keep rooms and walkways  well-lit. General instructions Talk with your health care provider about your risks for falling. Tell your health care provider if: You fall. Be sure to tell your health care provider about all falls, even ones that seem minor. You feel dizzy, tiredness (fatigue), or off-balance. Take over-the-counter and prescription medicines only as told by your health care provider. These include  supplements. Eat a healthy diet and maintain a healthy weight. A healthy diet includes low-fat dairy products, low-fat (lean) meats, and fiber from whole grains, beans, and lots of fruits and vegetables. Stay current with your vaccines. Schedule regular health, dental, and eye exams. Summary Having a healthy lifestyle and getting preventive care can help to protect your health and wellness after age 11. Screening and testing are the best way to find a health problem early and help you avoid having a fall. Early diagnosis and treatment give you the best chance for managing medical conditions that are more common for people who are older than age 28. Falls are a major cause of broken bones and head injuries in people who are older than age 48. Take precautions to prevent a fall at home. Work with your health care provider to learn what changes you can make to improve your health and wellness and to prevent falls. This information is not intended to replace advice given to you by your health care provider. Make sure you discuss any questions you have with your health care provider. Document Revised: 04/20/2021 Document Reviewed: 04/20/2021 Elsevier Patient Education  2024 [employer].

## 2024-03-15 NOTE — Assessment & Plan Note (Signed)
Diet and nutrition discussed Lipid profile done today Continues rosuvastatin 10 mg daily

## 2024-03-15 NOTE — Assessment & Plan Note (Addendum)
 BP Readings from Last 3 Encounters:  03/15/24 132/78  02/23/24 (!) 148/79  09/15/23 130/78  Well-controlled hypertension Cardiovascular risks associated with hypertension discussed Continues valsartan 80 mg daily Dietary approaches to stop hypertension discussed

## 2024-03-15 NOTE — Assessment & Plan Note (Signed)
On lifetime anticoagulation No significant clinical bleeding episodes Continues Eliquis 5 mg twice a day Stable chronic condition

## 2024-03-15 NOTE — Assessment & Plan Note (Signed)
Stable and well-controlled. Continues methotrexate 15 mg weekly

## 2024-03-15 NOTE — Assessment & Plan Note (Signed)
 Wt Readings from Last 3 Encounters:  03/15/24 233 lb (105.7 kg)  02/23/24 235 lb 12.8 oz (107 kg)  09/15/23 232 lb (105.2 kg)  Diet and nutrition discussed Advised to decrease amount of daily carbohydrate intake and daily calories and increase amount of plant-based protein in his diet
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
## 2024-03-15 NOTE — Assessment & Plan Note (Signed)
No clinical bleeding problems No recent falls

## 2024-04-19 DIAGNOSIS — M0609 Rheumatoid arthritis without rheumatoid factor, multiple sites: Secondary | ICD-10-CM | POA: Diagnosis not present

## 2024-04-19 DIAGNOSIS — M7710 Lateral epicondylitis, unspecified elbow: Secondary | ICD-10-CM | POA: Diagnosis not present

## 2024-04-19 DIAGNOSIS — R768 Other specified abnormal immunological findings in serum: Secondary | ICD-10-CM | POA: Diagnosis not present

## 2024-04-19 DIAGNOSIS — Z79899 Other long term (current) drug therapy: Secondary | ICD-10-CM | POA: Diagnosis not present

## 2024-04-19 DIAGNOSIS — M25569 Pain in unspecified knee: Secondary | ICD-10-CM | POA: Diagnosis not present

## 2024-04-19 DIAGNOSIS — M25521 Pain in right elbow: Secondary | ICD-10-CM | POA: Diagnosis not present

## 2024-04-19 DIAGNOSIS — M1990 Unspecified osteoarthritis, unspecified site: Secondary | ICD-10-CM | POA: Diagnosis not present

## 2024-06-19 ENCOUNTER — Ambulatory Visit: Payer: Medicare PPO

## 2024-07-05 ENCOUNTER — Ambulatory Visit

## 2024-07-05 VITALS — Ht 65.0 in | Wt 233.0 lb

## 2024-07-05 DIAGNOSIS — Z1159 Encounter for screening for other viral diseases: Secondary | ICD-10-CM | POA: Diagnosis not present

## 2024-07-05 DIAGNOSIS — Z0000 Encounter for general adult medical examination without abnormal findings: Secondary | ICD-10-CM
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
## 2024-07-05 NOTE — Patient Instructions (Addendum)
 Jerome Lee , Thank you for taking time out of your busy schedule to complete your Annual Wellness Visit with me. I enjoyed our conversation and look forward to speaking with you again next year. I, as well as your care team,  appreciate your ongoing commitment to your health goals. Please review the following plan we discussed and let me know if I can assist you in the future. Your Game plan/ To Do List     Follow up Visits: Next Medicare AWV with our clinical staff: 07/09/2025   Have you seen your provider in the last 6 months (3 months if uncontrolled diabetes)? No Next Office Visit with your provider: 09/17/2024  Clinician Recommendations:  Aim for 30 minutes of exercise or brisk walking, 6-8 glasses of water, and 5 servings of fruits and vegetables each day. Educated and advised on getting the Tdap and COVID vaccines in 2025.      This is a list of the screening recommended for you and due dates:  Health Maintenance  Topic Date Due   Hepatitis C Screening  Never done   COVID-19 Vaccine (6 - 2024-25 season) 08/14/2023   Flu Shot  07/13/2024   Medicare Annual Wellness Visit  07/05/2025   Colon Cancer Screening  04/17/2028   Pneumococcal Vaccine for age over 15  Completed   Zoster (Shingles) Vaccine  Completed   Hepatitis B Vaccine  Aged Out   HPV Vaccine  Aged Out   Meningitis B Vaccine  Aged Out   DTaP/Tdap/Td vaccine  Discontinued    Advanced directives: (Provided) Advance directive discussed with you today. I have provided a copy for you to complete at home and have notarized. Once this is complete, please bring a copy in to our office so we can scan it into your chart.  Advance Care Planning is important because it:  [x]  Makes sure you receive the medical care that is consistent with your values, goals, and preferences  [x]  It provides guidance to your family and loved ones and reduces their decisional burden about whether or not they are making the right decisions based on  your wishes.  Follow the link provided in your after visit summary or read over the paperwork we have mailed to you to help you started getting your Advance Directives in place. If you need assistance in completing these, please reach out to us  so that we can help you!

## 2024-07-24 DIAGNOSIS — R768 Other specified abnormal immunological findings in serum: Secondary | ICD-10-CM | POA: Diagnosis not present

## 2024-07-24 DIAGNOSIS — M25569 Pain in unspecified knee: Secondary | ICD-10-CM | POA: Diagnosis not present

## 2024-07-24 DIAGNOSIS — M1990 Unspecified osteoarthritis, unspecified site: Secondary | ICD-10-CM | POA: Diagnosis not present

## 2024-07-24 DIAGNOSIS — Z79899 Other long term (current) drug therapy: Secondary | ICD-10-CM | POA: Diagnosis not present

## 2024-07-24 DIAGNOSIS — M0609 Rheumatoid arthritis without rheumatoid factor, multiple sites: Secondary | ICD-10-CM | POA: Diagnosis not present

## 2024-08-20 ENCOUNTER — Telehealth: Payer: Self-pay

## 2024-08-20 NOTE — Telephone Encounter (Signed)
 Copied from CRM #[id number]. Topic: Medical Record Request - Other >> Aug 20, 2024 10:51 AM Charolett L wrote: Reason for CRM: patient stated that he will be changing the pharmacy to [facility] and he doesn't have all the info to update but [facility] will be faxing info over

## 2024-08-29 ENCOUNTER — Other Ambulatory Visit: Payer: Self-pay | Admitting: Emergency Medicine

## 2024-08-29 DIAGNOSIS — E785 Hyperlipidemia, unspecified: Secondary | ICD-10-CM

## 2024-08-29 DIAGNOSIS — I10 Essential (primary) hypertension: Secondary | ICD-10-CM

## 2024-08-29 MED ORDER — ROSUVASTATIN CALCIUM 10 MG PO TABS: 10.0000 mg | ORAL_TABLET | Freq: Every day | ORAL | 3 refills | Status: DC

## 2024-08-29 MED ORDER — VALSARTAN 80 MG PO TABS: 80.0000 mg | ORAL_TABLET | Freq: Every day | ORAL | 3 refills | Status: DC

## 2024-08-29 NOTE — Telephone Encounter (Signed)
 Copied from CRM [id number]. Topic: Clinical - Medication Refill >> Aug 29, 2024  9:32 AM Tinnie C wrote: Medication:  rosuvastatin  (CRESTOR ) 10 MG tablet (per pharmacy, it is the pen)  valsartan  (DIOVAN ) 80 MG tablet    Has the patient contacted their pharmacy? Yes (Agent: If no, request that the patient contact the pharmacy for the refill. If patient does not wish to contact the pharmacy document the reason why and proceed with request.) (Agent: If yes, when and what did the pharmacy advise?)  Stephanie from [facility] is the one reaching out to us  to check the status of the request. I do not see a request.   This is the patient's preferred pharmacy:   [facility] - [place], [place] - [address] [place] [address] Phone: [phone] Fax: [phone]  Is this the correct pharmacy for this prescription? Yes If no, delete pharmacy and type the correct one.   Has the prescription been filled recently? Filled 11/17/23  Is the patient out of the medication? Yes  Has the patient been seen for an appointment in the last year OR does the patient have an upcoming appointment? Yes  Can we respond through MyChart? Yes  Agent: Please be advised that Rx refills may take up to 3 business days. We ask that you follow-up with your pharmacy.

## 2024-09-17 ENCOUNTER — Encounter: Payer: Self-pay | Admitting: Emergency Medicine

## 2024-09-17 ENCOUNTER — Ambulatory Visit: Payer: Self-pay | Admitting: Emergency Medicine

## 2024-09-17 ENCOUNTER — Ambulatory Visit: Admitting: Emergency Medicine

## 2024-09-17 VITALS — BP 120/74 | HR 97 | Temp 98.1°F | Ht 65.0 in | Wt 236.0 lb

## 2024-09-17 DIAGNOSIS — M069 Rheumatoid arthritis, unspecified: Secondary | ICD-10-CM

## 2024-09-17 DIAGNOSIS — E785 Hyperlipidemia, unspecified: Secondary | ICD-10-CM | POA: Diagnosis not present

## 2024-09-17 DIAGNOSIS — Z125 Encounter for screening for malignant neoplasm of prostate: Secondary | ICD-10-CM

## 2024-09-17 DIAGNOSIS — Z23 Encounter for immunization: Secondary | ICD-10-CM | POA: Diagnosis not present

## 2024-09-17 DIAGNOSIS — Z1159 Encounter for screening for other viral diseases: Secondary | ICD-10-CM | POA: Diagnosis not present

## 2024-09-17 DIAGNOSIS — I10 Essential (primary) hypertension: Secondary | ICD-10-CM | POA: Diagnosis not present

## 2024-09-17 DIAGNOSIS — Z7901 Long term (current) use of anticoagulants: Secondary | ICD-10-CM | POA: Diagnosis not present

## 2024-09-17 DIAGNOSIS — I2782 Chronic pulmonary embolism: Secondary | ICD-10-CM | POA: Diagnosis not present

## 2024-09-17 DIAGNOSIS — G8929 Other chronic pain: Secondary | ICD-10-CM

## 2024-09-17 DIAGNOSIS — M25561 Pain in right knee: Secondary | ICD-10-CM

## 2024-09-17 DIAGNOSIS — D571 Sickle-cell disease without crisis: Secondary | ICD-10-CM | POA: Insufficient documentation

## 2024-09-17 LAB — CBC WITH DIFFERENTIAL/PLATELET
Basophils Absolute: 0.1 K/uL (ref 0.0–0.1)
Basophils Relative: 0.7 % (ref 0.0–3.0)
Eosinophils Absolute: 0.1 K/uL (ref 0.0–0.7)
Eosinophils Relative: 1.7 % (ref 0.0–5.0)
HCT: 45.8 % (ref 39.0–52.0)
Hemoglobin: 14.7 g/dL (ref 13.0–17.0)
Lymphocytes Relative: 23.7 % (ref 12.0–46.0)
Lymphs Abs: 1.7 K/uL (ref 0.7–4.0)
MCHC: 32 g/dL (ref 30.0–36.0)
MCV: 90.3 fl (ref 78.0–100.0)
Monocytes Absolute: 0.7 K/uL (ref 0.1–1.0)
Monocytes Relative: 9.3 % (ref 3.0–12.0)
Neutro Abs: 4.7 K/uL (ref 1.4–7.7)
Neutrophils Relative %: 64.6 % (ref 43.0–77.0)
Platelets: 241 K/uL (ref 150.0–400.0)
RBC: 5.07 Mil/uL (ref 4.22–5.81)
RDW: 14.2 % (ref 11.5–15.5)
WBC: 7.3 K/uL (ref 4.0–10.5)

## 2024-09-17 LAB — COMPREHENSIVE METABOLIC PANEL WITH GFR
ALT: 25 U/L (ref 0–53)
AST: 22 U/L (ref 0–37)
Albumin: 4.6 g/dL (ref 3.5–5.2)
Alkaline Phosphatase: 44 U/L (ref 39–117)
BUN: 15 mg/dL (ref 6–23)
CO2: 26 meq/L (ref 19–32)
Calcium: 10.3 mg/dL (ref 8.4–10.5)
Chloride: 104 meq/L (ref 96–112)
Creatinine, Ser: 1.16 mg/dL (ref 0.40–1.50)
GFR: 62.75 mL/min (ref >60.00)
Glucose, Bld: 100 mg/dL — ABNORMAL HIGH (ref 70–99)
Potassium: 4.9 meq/L (ref 3.5–5.1)
Sodium: 143 meq/L (ref 135–145)
Total Bilirubin: 1.2 mg/dL (ref 0.2–1.2)
Total Protein: 7.3 g/dL (ref 6.0–8.3)

## 2024-09-17 LAB — LIPID PANEL
Cholesterol: 162 mg/dL (ref 0–200)
HDL: 53.3 mg/dL (ref >39.00)
LDL Cholesterol: 97 mg/dL (ref 0–99)
NonHDL: 109.11
Total CHOL/HDL Ratio: 3
Triglycerides: 61 mg/dL (ref 0.0–149.0)
VLDL: 12.2 mg/dL (ref 0.0–40.0)

## 2024-09-17 LAB — PSA: PSA: 3.92 ng/mL (ref 0.10–4.00)

## 2024-09-17 LAB — HEMOGLOBIN A1C: Hgb A1c MFr Bld: 4.9 % (ref 4.6–6.5)

## 2024-09-17 NOTE — Assessment & Plan Note (Signed)
No clinical bleeding problems No recent falls

## 2024-09-17 NOTE — Assessment & Plan Note (Signed)
 BP Readings from Last 3 Encounters:  09/17/24 120/74  03/15/24 132/78  02/23/24 (!) 148/79  Well-controlled hypertension Cardiovascular risks associated with hypertension discussed Continues valsartan  80 mg daily Dietary approaches to stop hypertension discussed

## 2024-09-17 NOTE — Assessment & Plan Note (Signed)
 Wt Readings from Last 3 Encounters:  09/17/24 236 lb (107 kg)  07/05/24 233 lb (105.7 kg)  03/15/24 233 lb (105.7 kg)   Diet and nutrition discussed Advised to decrease amount of daily carbohydrate intake and daily calories and increase amount of plant-based protein in his diet

## 2024-09-17 NOTE — Patient Instructions (Signed)
 Health Maintenance After Age 73 After age 27, you are at a higher risk for certain long-term diseases and infections as well as injuries from falls. Falls are a major cause of broken bones and head injuries in people who are older than age 73. Getting regular preventive care can help to keep you healthy and well. Preventive care includes getting regular testing and making lifestyle changes as recommended by your health care provider. Talk with your health care provider about: Which screenings and tests you should have. A screening is a test that checks for a disease when you have no symptoms. A diet and exercise plan that is right for you. What should I know about screenings and tests to prevent falls? Screening and testing are the best ways to find a health problem early. Early diagnosis and treatment give you the best chance of managing medical conditions that are common after age 90. Certain conditions and lifestyle choices may make you more likely to have a fall. Your health care provider may recommend: Regular vision checks. Poor vision and conditions such as cataracts can make you more likely to have a fall. If you wear glasses, make sure to get your prescription updated if your vision changes. Medicine review. Work with your health care provider to regularly review all of the medicines you are taking, including over-the-counter medicines. Ask your health care provider about any side effects that may make you more likely to have a fall. Tell your health care provider if any medicines that you take make you feel dizzy or sleepy. Strength and balance checks. Your health care provider may recommend certain tests to check your strength and balance while standing, walking, or changing positions. Foot health exam. Foot pain and numbness, as well as not wearing proper footwear, can make you more likely to have a fall. Screenings, including: Osteoporosis screening. Osteoporosis is a condition that causes  the bones to get weaker and break more easily. Blood pressure screening. Blood pressure changes and medicines to control blood pressure can make you feel dizzy. Depression screening. You may be more likely to have a fall if you have a fear of falling, feel depressed, or feel unable to do activities that you used to do. Alcohol  use screening. Using too much alcohol  can affect your balance and may make you more likely to have a fall. Follow these instructions at home: Lifestyle Do not drink alcohol  if: Your health care provider tells you not to drink. If you drink alcohol : Limit how much you have to: 0-1 drink a day for women. 0-2 drinks a day for men. Know how much alcohol  is in your drink. In the [place] one drink equals one 12 oz bottle of beer (355 mL), one 5 oz glass of wine (148 mL), or one 1 oz glass of hard liquor (44 mL). Do not use any products that contain nicotine or tobacco. These products include cigarettes, chewing tobacco, and vaping devices, such as e-cigarettes. If you need help quitting, ask your health care provider. Activity  Follow a regular exercise program to stay fit. This will help you maintain your balance. Ask your health care provider what types of exercise are appropriate for you. If you need a cane or walker, use it as recommended by your health care provider. Wear supportive shoes that have nonskid soles. Safety  Remove any tripping hazards, such as rugs, cords, and clutter. Install safety equipment such as grab bars in bathrooms and safety rails on stairs. Keep rooms and walkways  well-lit. General instructions Talk with your health care provider about your risks for falling. Tell your health care provider if: You fall. Be sure to tell your health care provider about all falls, even ones that seem minor. You feel dizzy, tiredness (fatigue), or off-balance. Take over-the-counter and prescription medicines only as told by your health care provider. These include  supplements. Eat a healthy diet and maintain a healthy weight. A healthy diet includes low-fat dairy products, low-fat (lean) meats, and fiber from whole grains, beans, and lots of fruits and vegetables. Stay current with your vaccines. Schedule regular health, dental, and eye exams. Summary Having a healthy lifestyle and getting preventive care can help to protect your health and wellness after age 15. Screening and testing are the best way to find a health problem early and help you avoid having a fall. Early diagnosis and treatment give you the best chance for managing medical conditions that are more common for people who are older than age 42. Falls are a major cause of broken bones and head injuries in people who are older than age 64. Take precautions to prevent a fall at home. Work with your health care provider to learn what changes you can make to improve your health and wellness and to prevent falls. This information is not intended to replace advice given to you by your health care provider. Make sure you discuss any questions you have with your health care provider. Document Revised: 04/20/2021 Document Reviewed: 04/20/2021 Elsevier Patient Education  2024 [employer].

## 2024-09-17 NOTE — Assessment & Plan Note (Signed)
Stable and well-controlled. Continues methotrexate 15 mg weekly

## 2024-09-17 NOTE — Assessment & Plan Note (Signed)
Diet and nutrition discussed Lipid profile done today Continues rosuvastatin 10 mg daily

## 2024-09-17 NOTE — Assessment & Plan Note (Signed)
On lifetime anticoagulation No significant clinical bleeding episodes Continues Eliquis 5 mg twice a day Stable chronic condition

## 2024-09-17 NOTE — Assessment & Plan Note (Signed)
 Chronic pain to right knee Has significant surgery back in 2005 Has pain on and off. Recommend orthopedic evaluation Advised to contact his orthopedist office and schedule appointment.
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
## 2024-09-18 LAB — HEPATITIS C ANTIBODY: Hepatitis C Ab: NONREACTIVE

## 2024-09-19 ENCOUNTER — Encounter: Payer: Self-pay | Admitting: Emergency Medicine

## 2024-09-20 NOTE — Telephone Encounter (Signed)
 No

## 2024-10-25 DIAGNOSIS — I4891 Unspecified atrial fibrillation: Secondary | ICD-10-CM | POA: Diagnosis not present

## 2024-10-25 DIAGNOSIS — M069 Rheumatoid arthritis, unspecified: Secondary | ICD-10-CM | POA: Diagnosis not present

## 2024-10-25 DIAGNOSIS — Z7901 Long term (current) use of anticoagulants: Secondary | ICD-10-CM | POA: Diagnosis not present

## 2024-10-25 DIAGNOSIS — I129 Hypertensive chronic kidney disease with stage 1 through stage 4 chronic kidney disease, or unspecified chronic kidney disease: Secondary | ICD-10-CM | POA: Diagnosis not present

## 2024-10-25 DIAGNOSIS — D84821 Immunodeficiency due to drugs: Secondary | ICD-10-CM | POA: Diagnosis not present

## 2024-10-25 DIAGNOSIS — D6869 Other thrombophilia: Secondary | ICD-10-CM | POA: Diagnosis not present

## 2024-10-25 DIAGNOSIS — N1831 Chronic kidney disease, stage 3a: Secondary | ICD-10-CM | POA: Diagnosis not present

## 2024-10-25 DIAGNOSIS — M1990 Unspecified osteoarthritis, unspecified site: Secondary | ICD-10-CM | POA: Diagnosis not present

## 2024-10-30 DIAGNOSIS — M25569 Pain in unspecified knee: Secondary | ICD-10-CM | POA: Diagnosis not present

## 2024-10-30 DIAGNOSIS — M0609 Rheumatoid arthritis without rheumatoid factor, multiple sites: Secondary | ICD-10-CM | POA: Diagnosis not present

## 2024-10-30 DIAGNOSIS — M1990 Unspecified osteoarthritis, unspecified site: Secondary | ICD-10-CM | POA: Diagnosis not present

## 2024-10-30 DIAGNOSIS — R760 Raised antibody titer: Secondary | ICD-10-CM | POA: Diagnosis not present

## 2024-10-30 DIAGNOSIS — Z79899 Other long term (current) drug therapy: Secondary | ICD-10-CM | POA: Diagnosis not present

## 2024-11-02 ENCOUNTER — Other Ambulatory Visit: Payer: Self-pay | Admitting: Emergency Medicine

## 2024-11-02 DIAGNOSIS — E785 Hyperlipidemia, unspecified: Secondary | ICD-10-CM

## 2024-11-02 DIAGNOSIS — I10 Essential (primary) hypertension: Secondary | ICD-10-CM

## 2025-02-22 ENCOUNTER — Ambulatory Visit: Admitting: Pulmonary Disease

## 2025-03-19 ENCOUNTER — Ambulatory Visit: Admitting: Emergency Medicine

## 2025-07-09 ENCOUNTER — Ambulatory Visit
